# Patient Record
Sex: Male | Born: 1940 | Race: White | Hispanic: No | Marital: Married | Smoking: Current some day smoker
Health system: Southern US, Community
[De-identification: ages and names within clinical notes are randomized; demographics above are authoritative.]

## PROBLEM LIST (undated history)

## (undated) DIAGNOSIS — M419 Scoliosis, unspecified: Secondary | ICD-10-CM

## (undated) DIAGNOSIS — E119 Type 2 diabetes mellitus without complications: Secondary | ICD-10-CM

## (undated) DIAGNOSIS — I4891 Unspecified atrial fibrillation: Secondary | ICD-10-CM

## (undated) DIAGNOSIS — C679 Malignant neoplasm of bladder, unspecified: Secondary | ICD-10-CM

## (undated) DIAGNOSIS — C189 Malignant neoplasm of colon, unspecified: Secondary | ICD-10-CM

## (undated) DIAGNOSIS — Z87442 Personal history of urinary calculi: Secondary | ICD-10-CM

## (undated) DIAGNOSIS — M1990 Unspecified osteoarthritis, unspecified site: Secondary | ICD-10-CM

## (undated) DIAGNOSIS — I10 Essential (primary) hypertension: Secondary | ICD-10-CM

## (undated) DIAGNOSIS — I499 Cardiac arrhythmia, unspecified: Secondary | ICD-10-CM

## (undated) HISTORY — PX: TONSILLECTOMY: SUR1361

## (undated) HISTORY — PX: APPENDECTOMY: SHX54

## (undated) HISTORY — PX: CHOLECYSTECTOMY: SHX55

## (undated) HISTORY — DX: Malignant neoplasm of colon, unspecified: C18.9

---

## 1995-02-18 DIAGNOSIS — C189 Malignant neoplasm of colon, unspecified: Secondary | ICD-10-CM

## 1995-02-18 HISTORY — PX: COLECTOMY: SHX59

## 1995-02-18 HISTORY — DX: Malignant neoplasm of colon, unspecified: C18.9

## 2011-03-30 LAB — COMPREHENSIVE METABOLIC PANEL
Albumin: 3.9 g/dL (ref 3.4–5.0)
Alkaline Phosphatase: 57 U/L (ref 50–136)
Anion Gap: 10 (ref 7–16)
BUN: 13 mg/dL (ref 7–18)
Calcium, Total: 9 mg/dL (ref 8.5–10.1)
Co2: 28 mmol/L (ref 21–32)
EGFR (Non-African Amer.): >60
Glucose: 237 mg/dL — ABNORMAL HIGH (ref 65–99)
SGOT(AST): 17 U/L (ref 15–37)
Total Protein: 7.1 g/dL (ref 6.4–8.2)

## 2011-03-30 LAB — CBC
HCT: 49.3 % (ref 40.0–52.0)
MCH: 30.6 pg (ref 26.0–34.0)
MCHC: 34 g/dL (ref 32.0–36.0)
MCV: 90 fL (ref 80–100)
RDW: 14.1 % (ref 11.5–14.5)
WBC: 8.8 10*3/uL (ref 3.8–10.6)

## 2011-03-31 ENCOUNTER — Observation Stay: Payer: Self-pay | Admitting: Internal Medicine

## 2011-03-31 LAB — HEMOGLOBIN A1C: Hemoglobin A1C: 8.5 % — ABNORMAL HIGH (ref 4.2–6.3)

## 2014-06-11 NOTE — Discharge Summary (Signed)
PATIENT NAME:  Ryan Peters, Ryan Peters MR#:  [id number] DATE OF BIRTH:  02-04-1941  DATE OF ADMISSION:  03/31/2011 DATE OF DISCHARGE:  03/31/2011  DISCHARGE DIAGNOSIS: Double vision likely secondary to new onset diabetes mellitus, type II.  MEDICATIONS:  1. Metformin 500 mg p.o. b.i.d.  2. Lisinopril 5 mg per day. 3. Aspirin 81 mg p.o. daily.   DIET: ADA diet.   FOLLOW-UP: Follow-up with the patient's primary doctor. The patient has no primary doctor but he would like to see Dr. Ronnald Collum so I asked him to schedule appointment with Dr. Ronnald Collum for diabetes follow-up.   CONSULTATION: Diabetic teaching.   HOSPITAL COURSE: The patient is a 74 year old male patient with no significant past medical history who came in because of blurry vision and double vision which actually resolved the next day. The patient also thought he had a facial droop. He is edentulous but did not have any facial droop. He was admitted for possible TIA versus CVA. He has history of colon cancer status post chemotherapy and radiation in remission. Initial blood pressure was 130/73, pulse 75, respirations 18. The patient has no focal neurological deficits. CT of the head did not show any acute changes. However, the patient's blood glucose showed 237 and his hemoglobin A1c showed 8.5. We have discussed options of medications and also education about diabetes was given by the diabetic nurse and he was started on metformin 500 mg p.o. b.i.d. along with ADA diet. The patient's other labs are within normal limits including WBC and BMP. He had a carotid ultrasound and MRI of the brain for initial stroke work-up. Carotid ultrasound did not show any hemodynamically significant stenosis and MRI of the brain also showed subcortical and deep white matter changes but no evidence of acute ischemia. I told the patient that he can follow-up with Dr. Ronnald Collum for his diabetes and also needs appointment with ophthalmologist for his double vision which  actually resolved when I examined him the same day later. The patient said he will follow-up with an ophthalmologist.  CONDITION ON DISCHARGE: Stable.   DISCHARGE PREPARATION: Less than 30 minutes.   ____________________________ Epifanio Lesches, MD sk:drc D: 04/02/2011 22:32:06 ET T: 04/03/2011 12:30:29 ET JOB#: [id number]  cc: Epifanio Lesches, MD, <Dictator> Ryan Simmer, MD Epifanio Lesches MD ELECTRONICALLY SIGNED 04/07/2011 16:21

## 2014-06-11 NOTE — H&P (Signed)
PATIENT NAME:  Ryan Peters, Ryan Peters MR#:  [id number] DATE OF BIRTH:  [birth year]  DATE OF ADMISSION:  03/31/2011  PRIMARY CARE PHYSICIAN: Does not have one.   CHIEF COMPLAINT: Blurry vision and double vision.   HISTORY OF PRESENT ILLNESS: This is a 74 year old male who comes in from home due to blurry and double vision which has been intermittently going on for about two days. He describes that when he closes one eye he does not have any blurry vision, but when he looks with both eyes he sees double and has blurry vision.  This has been going on for the past couple of days. He denies any headache with these symptoms. He was brought to the ER for further evaluation. When evaluated by the ER physician he was noted to also have a slight left-sided facial droop, which I was not able to appreciate when I examined and saw the patient.  Hospitalist services were contacted for evaluation of suspected transient ischemic attack/cerebrovascular accident. The patient denies any headache presently. He denies any nausea, vomiting, fevers, chills, cough, chest pain, shortness of breath, or any other associated symptoms presently.   His blurry vision presently has resolved. He denies any numbness, tingling, or any focal weakness.   REVIEW OF SYSTEMS:  CONSTITUTIONAL: No documented fever. No weight gain, no weight loss. EYES: Positive blurry and double vision. ENT: No tinnitus or postnasal drip. No redness of the oropharynx. RESPIRATORY: No cough, no wheeze, no hemoptysis. CARDIOVASCULAR: No chest pain, no orthopnea, no palpitations, no syncope. GI: No nausea, vomiting, diarrhea, abdominal pain, melena, or hematochezia. GU: No dysuria or hematuria.  ENDOCRINE: No polyuria, nocturia, or heat or cold intolerance. HEME: No anemia. No bruising. No bleeding. INTEGUMENT: No rashes. No lesions. MUSCULOSKELETAL: No arthritis, no swelling, and no gout. NEUROLOGIC: No numbness, no tingling, no ataxia. No seizure-type activity. PSYCH: No  anxiety, no insomnia, no ADD.   PAST MEDICAL HISTORY:  1. Colon cancer status post surgery and chemotherapy, currently in remission.  2. History of ongoing tobacco abuse.   ALLERGIES: No known drug allergies.   SOCIAL HISTORY: Does smoke about a pack per day, has been smoking for the past 40+ years. No alcohol abuse. No illicit drug abuse. Lives at home with his wife.   FAMILY HISTORY: Both mother and father had diabetes and died from complications of heart disease.   CURRENT MEDICATIONS: He is currently on no medications.   PHYSICAL EXAMINATION ON ADMISSION:  VITAL SIGNS: He is afebrile, pulse 75, respirations 18, blood pressure 133/73, saturations 95% on room air.   GENERAL: The patient is a pleasant-appearing male in no apparent distress.   HEENT: The patient is atraumatic, normocephalic. Extraocular muscles are intact. Pupils are equal and reactive to light. Sclerae anicteric. No conjunctival injection. No pharyngeal erythema.   NECK: Supple. No jugular venous distention, no bruits, no lymphadenopathy, no thyromegaly.   HEART: Regular rate and rhythm. No murmurs, rubs, or clicks.   LUNGS: Clear to auscultation bilaterally. No rales, no rhonchi, no wheezes.   ABDOMEN: Soft, flat, nontender, nondistended. Has good bowel sounds. No hepatosplenomegaly appreciated.   EXTREMITIES:  No evidence of any cyanosis, clubbing, or peripheral edema. Has +2 pedal and radial pulses bilaterally.   NEUROLOGICAL: The patient is alert, awake, and oriented times three with no focal motor or sensory deficits appreciated bilaterally.   SKIN: Moist and warm with no rash appreciated.   LYMPHATIC: There is no cervical or axillary lymphadenopathy.  LABORATORY, DIAGNOSTIC, AND RADIOLOGICAL DATA:  Serum  glucose 237, BUN 13, creatinine 0.9, sodium 141, potassium 3.8, chloride 103, bicarbonate 28. The patient's liver function tests are within normal limits. Troponin less than 0.02. White cell count 8.8,  hemoglobin 16.8, hematocrit 49.3, platelet count 194.   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   ASSESSMENT AND PLAN: This is a 74 year old male with a history of colon cancer status post surgery and chemotherapy and ongoing tobacco abuse who came into the hospital with blurry and double vision and also slight left facial droop.  1. Blurry and double vision with left facial droop: We suspect this is related to a transient ischemic attack although his facial droop has now improved. He does not have any acute neurological symptoms. His CT of the head is negative. I will go ahead and get an MRI of his brain, get a carotid duplex, continue him on aspirin for now. Follow neuro checks.  2. Hypertension: The patient's blood pressure was somewhat elevated when he arrived. He does not have a history of hypertension. He is not currently taking any medications. I will tolerate some element of hypertension, suspicious of a stroke at this point. Place him on p.r.n. IV hydralazine and follow his hemodynamics.  3. History of colon cancer: Now in remission. No acute issues related to this.  4. New onset diabetes: The patient's blood sugar is over 200 at 237. I will check a hemoglobin A1c.  Place him on sliding scale insulin for now.  5. CODE STATUS:  The patient is a FULL CODE.  If the patient does truly have diabetes he likely will need a follow-up appointment with a primary care physician or primary care physician assigned.   TIME SPENT: 45 minutes.   ____________________________ Belia Heman. Verdell Carmine, MD vjs:bjt D: 03/31/2011 03:08:33 ET T: 03/31/2011 09:05:57 ET JOB#: [id number]  cc: Belia Heman. Verdell Carmine, MD, <Dictator> Henreitta Leber MD ELECTRONICALLY SIGNED 04/04/2011 10:36

## 2016-03-27 ENCOUNTER — Other Ambulatory Visit: Payer: Self-pay | Admitting: Urology

## 2016-03-31 ENCOUNTER — Encounter (HOSPITAL_COMMUNITY): Payer: Self-pay

## 2016-03-31 ENCOUNTER — Encounter (HOSPITAL_COMMUNITY)
Admission: RE | Admit: 2016-03-31 | Discharge: 2016-03-31 | Disposition: A | Discharge status: Home / Self Care | Payer: Medicare Other | Source: Ambulatory Visit | Attending: Urology | Admitting: Urology

## 2016-03-31 DIAGNOSIS — Z01812 Encounter for preprocedural laboratory examination: Secondary | ICD-10-CM | POA: Diagnosis not present

## 2016-03-31 DIAGNOSIS — C679 Malignant neoplasm of bladder, unspecified: Secondary | ICD-10-CM | POA: Diagnosis not present

## 2016-03-31 DIAGNOSIS — Z01810 Encounter for preprocedural cardiovascular examination: Secondary | ICD-10-CM | POA: Insufficient documentation

## 2016-03-31 HISTORY — DX: Type 2 diabetes mellitus without complications: E11.9

## 2016-03-31 HISTORY — DX: Essential (primary) hypertension: I10

## 2016-03-31 HISTORY — DX: Scoliosis, unspecified: M41.9

## 2016-03-31 HISTORY — DX: Unspecified osteoarthritis, unspecified site: M19.90

## 2016-03-31 HISTORY — DX: Personal history of urinary calculi: Z87.442

## 2016-03-31 LAB — CBC
HEMATOCRIT: 50.8 % (ref 39.0–52.0)
HEMOGLOBIN: 17.4 g/dL — AB (ref 13.0–17.0)
MCH: 31.2 pg (ref 26.0–34.0)
MCHC: 34.3 g/dL (ref 30.0–36.0)
MCV: 91.2 fL (ref 78.0–100.0)
Platelets: 220 10*3/uL (ref 150–400)
RBC: 5.57 MIL/uL (ref 4.22–5.81)
RDW: 14.1 % (ref 11.5–15.5)
WBC: 9.3 10*3/uL (ref 4.0–10.5)

## 2016-03-31 LAB — BASIC METABOLIC PANEL
Anion gap: 8 (ref 5–15)
BUN: 18 mg/dL (ref 6–20)
CHLORIDE: 104 mmol/L (ref 101–111)
CO2: 24 mmol/L (ref 22–32)
CREATININE: 1.35 mg/dL — AB (ref 0.61–1.24)
Calcium: 9.4 mg/dL (ref 8.9–10.3)
GFR calc Af Amer: 59 mL/min — ABNORMAL LOW (ref >60)
GFR calc non Af Amer: 50 mL/min — ABNORMAL LOW (ref >60)
Glucose, Bld: 133 mg/dL — ABNORMAL HIGH (ref 65–99)
Potassium: 5.1 mmol/L (ref 3.5–5.1)
Sodium: 136 mmol/L (ref 135–145)

## 2016-03-31 LAB — GLUCOSE, CAPILLARY: Glucose-Capillary: 145 mg/dL — ABNORMAL HIGH (ref 65–99)

## 2016-03-31 NOTE — Patient Instructions (Addendum)
Ryan Peters  03/31/2016   Your procedure is scheduled on: 04-09-16  Report to [facility] Main  Entrance take [facility]  elevators to 3rd floor to  [place] at 2:45PM.  Call this number if you have problems the morning of surgery [phone]   Remember: ONLY 1 PERSON MAY GO WITH YOU TO SHORT STAY TO GET  READY MORNING OF [place].  Do not eat food After Midnight. YOU MAY HAVE CLEAR LIQUIDS FROM MIDNIGHT UNTIL 1045AM ON DAY OF SURGERY. NOTHING BY MOUTH AFTER 10:45AM!     Take these medicines the morning of surgery with A SIP OF WATER: ALTENOL(TENORMIN) DO NOT TAKE ANY DIABETIC MEDICATIONS DAY OF YOUR SURGERY                               You may not have any metal on your body including hair pins and              piercings  Do not wear jewelry, make-up, lotions, powders or perfumes, deodorant             Do not wear nail polish.  Do not shave  48 hours prior to surgery.              Men may shave face and neck.   Do not bring valuables to the hospital. [place].  Contacts, dentures or bridgework may not be worn into surgery.  Leave suitcase in the car. After surgery it may be brought to your room.     Patients discharged the day of surgery will not be allowed to drive home.  Name and phone number of your driver:  Special Instructions: N/A              Please read over the following fact sheets you were given: _____________________________________________________________________     CLEAR LIQUID DIET   Foods Allowed                                                                     Foods Excluded  Coffee and tea, regular and decaf                             liquids that you cannot  Plain Jell-O in any flavor                                             see through such as: Fruit ices (not with fruit pulp)                                     milk, soups, orange juice  Iced Popsicles  All solid food Carbonated beverages, regular and diet                                    Cranberry, grape and apple juices Sports drinks like Gatorade Lightly seasoned clear broth or consume(fat free) Sugar, honey syrup  Sample Menu Breakfast                                Lunch                                     Supper Cranberry juice                    Beef broth                            Chicken broth Jell-O                                     Grape juice                           Apple juice Coffee or tea                        Jell-O                                      Popsicle                                                Coffee or tea                        Coffee or tea  _____________________________________________________________________             How to Manage Your Diabetes Before and After Surgery  Why is it important to control my blood sugar before and after surgery? . Improving blood sugar levels before and after surgery helps healing and can limit problems. . A way of improving blood sugar control is eating a healthy diet by: o  Eating less sugar and carbohydrates o  Increasing activity/exercise o  Talking with your doctor about reaching your blood sugar goals . High blood sugars (greater than 180 mg/dL) can raise your risk of infections and slow your recovery, so you will need to focus on controlling your diabetes during the weeks before surgery. . Make sure that the doctor who takes care of your diabetes knows about your planned surgery including the date and location.  How do I manage my blood sugar before surgery? . Check your blood sugar at least 4 times a day, starting 2 days before surgery, to make sure that the level is not too high or low. o Check your blood sugar the morning of your surgery when you wake up and every 2 hours until you get to the  Short Stay unit. . If your blood sugar is less than 70 mg/dL, you will need to treat  for low blood sugar: o Do not take insulin. o Treat a low blood sugar (less than 70 mg/dL) with  cup of clear juice (cranberry or apple), 4 glucose tablets, OR glucose gel. o Recheck blood sugar in 15 minutes after treatment (to make sure it is greater than 70 mg/dL). If your blood sugar is not greater than 70 mg/dL on recheck, call [phone] for further instructions. . Report your blood sugar to the short stay nurse when you get to Short Stay.  . If you are admitted to the hospital after surgery: o Your blood sugar will be checked by the staff and you will probably be given insulin after surgery (instead of oral diabetes medicines) to make sure you have good blood sugar levels. o The goal for blood sugar control after surgery is 80-180 mg/dL.   WHAT DO I DO ABOUT MY DIABETES MEDICATION?  Marland Kitchen Do not take oral diabetes medicines (pills) the morning of surgery.  . THE NIGHT BEFORE SURGERY, take  JANUMET AS USUAL      . THE MORNING OF SURGERY, DO NOT TAKE YOUR JANUMET!   Reviewed and Endorsed by [facility] Patient Education Committee, August 2015  [facility] - Preparing for Surgery Before surgery, you can play an important role.  Because skin is not sterile, your skin needs to be as free of germs as possible.  You can reduce the number of germs on your skin by washing with CHG (chlorahexidine gluconate) soap before surgery.  CHG is an antiseptic cleaner which kills germs and bonds with the skin to continue killing germs even after washing. Please DO NOT use if you have an allergy to CHG or antibacterial soaps.  If your skin becomes reddened/irritated stop using the CHG and inform your nurse when you arrive at Short Stay. Do not shave (including legs and underarms) for at least 48 hours prior to the first CHG shower.  You may shave your face/neck. Please follow these instructions carefully:  1.  Shower with CHG Soap the night before surgery and the  morning of Surgery.  2.  If you choose  to wash your hair, wash your hair first as usual with your  normal  shampoo.  3.  After you shampoo, rinse your hair and body thoroughly to remove the  shampoo.                           4.  Use CHG as you would any other liquid soap.  You can apply chg directly  to the skin and wash                       Gently with a scrungie or clean washcloth.  5.  Apply the CHG Soap to your body ONLY FROM THE NECK DOWN.   Do not use on face/ open                           Wound or open sores. Avoid contact with eyes, ears mouth and genitals (private parts).                       Wash face,  Genitals (private parts) with your normal soap.  6.  Wash thoroughly, paying special attention to the area where your surgery  will be performed.  7.  Thoroughly rinse your body with warm water from the neck down.  8.  DO NOT shower/wash with your normal soap after using and rinsing off  the CHG Soap.                9.  Pat yourself dry with a clean towel.            10.  Wear clean pajamas.            11.  Place clean sheets on your bed the night of your first shower and do not  sleep with pets. Day of Surgery : Do not apply any lotions/deodorants the morning of surgery.  Please wear clean clothes to the hospital/surgery center.  FAILURE TO FOLLOW THESE INSTRUCTIONS MAY RESULT IN THE CANCELLATION OF YOUR SURGERY PATIENT SIGNATURE_________________________________  NURSE SIGNATURE__________________________________  ________________________________________________________________________

## 2016-03-31 NOTE — Progress Notes (Signed)
ABNORMAL ECG SHOWN TO DR ROSE ANESTHESIA. PATIENT IS REQUIRED TO SEE A CARDIOLOGIST FRO CARDIAC CLEARANCE PRIOR TO SURGERY OR RISK CANCELLATION. LVMM WITH SELITA OF DR MANNY'S OFFICE TODAY CONCERNING THIS. PATIENT HAS ALSO BEEN MADE AWARE OF NEED FOR CARDIAC CLEARANCE. NURSE WILL FOLLOW UP AS NEEDED

## 2016-03-31 NOTE — Progress Notes (Signed)
Spoke to [facility] [place] from [facility] and she informed me that patient has a scheduled appointment with cardiologist office with PA-C Lauretta Chester on 04-04-16. Most recent EKGS were faxed over to [facility] office for purpose of comparison.

## 2016-04-01 LAB — HEMOGLOBIN A1C
Hgb A1c MFr Bld: 6 % — ABNORMAL HIGH (ref 4.8–5.6)
Mean Plasma Glucose: 126 mg/dL

## 2016-04-04 ENCOUNTER — Encounter: Payer: Self-pay | Admitting: Physician Assistant

## 2016-04-04 ENCOUNTER — Ambulatory Visit (INDEPENDENT_AMBULATORY_CARE_PROVIDER_SITE_OTHER): Payer: Medicare Other | Admitting: Cardiology

## 2016-04-04 VITALS — BP 140/88 | HR 100 | Ht 69.0 in | Wt 197.8 lb

## 2016-04-04 DIAGNOSIS — Z01810 Encounter for preprocedural cardiovascular examination: Secondary | ICD-10-CM | POA: Diagnosis not present

## 2016-04-04 DIAGNOSIS — R9431 Abnormal electrocardiogram [ECG] [EKG]: Secondary | ICD-10-CM | POA: Diagnosis not present

## 2016-04-04 DIAGNOSIS — I481 Persistent atrial fibrillation: Secondary | ICD-10-CM | POA: Diagnosis not present

## 2016-04-04 DIAGNOSIS — I4819 Other persistent atrial fibrillation: Secondary | ICD-10-CM

## 2016-04-04 MED ORDER — RIVAROXABAN 20 MG PO TABS
20.0000 mg | ORAL_TABLET | Freq: Every day | ORAL | 9 refills | Status: DC
Start: 2016-04-04 — End: 2016-04-09

## 2016-04-04 NOTE — Progress Notes (Deleted)
Cardiology Office Note   Date:  04/04/2016   ID:  Ryan Peters, DOB 1940-09-21, MRN [id number]  PCP:  Lenard Simmer, MD  Cardiologist:  Ival Bible, PA-C   No chief complaint on file.   History of Present Illness: Ryan Peters is a 76 y.o. male with a history of CKD, DM, HTN, colon CA  Pt needs surgery, ECG performed for preoperative evaluation and patient was in atrial fib with a RBBB, cardiology appointment made. RBBB present on ECG 2013  Ryan Peters presents for ***   Past Medical History:  Diagnosis Date  . Arthritis   . Chronic kidney disease    KIDNEY MASS  . Colon cancer ([place]) 1997  . Diabetes mellitus without complication ([place])   . History of kidney stones   . Hypertension   . Scoliosis     Past Surgical History:  Procedure Laterality Date  . APPENDECTOMY    . CHOLECYSTECTOMY    . COLECTOMY  1997  . TONSILLECTOMY      Current Outpatient Prescriptions  Medication Sig Dispense Refill  . aspirin EC 325 MG tablet Take 325 mg by mouth at bedtime.    Marland Kitchen atenolol (TENORMIN) 25 MG tablet Take 25 mg by mouth daily.    Marland Kitchen lisinopril (PRINIVIL,ZESTRIL) 5 MG tablet Take 5 mg by mouth daily.    Marland Kitchen MEGARED OMEGA-3 KRILL OIL 500 MG CAPS Take 500 mg by mouth See admin instructions. 3-4 times a week between 12pm-2pm    . naproxen sodium (ANAPROX) 220 MG tablet Take 440-660 mg by mouth daily as needed (for pain (seldom)).    Marland Kitchen sitaGLIPtin-metformin (JANUMET) 50-1000 MG tablet Take 1 tablet by mouth 2 (two) times daily.     No current facility-administered medications for this visit.     Allergies:   Patient has no known allergies.    Social History:  The patient  reports that he has been smoking Cigarettes.  He has a 25.00 pack-year smoking history. He has never used smokeless tobacco. He reports that he does not drink alcohol or use drugs.   Family History:  The patient's family history includes Heart attack in his mother.    ROS:  Please see the  history of present illness. All other systems are reviewed and negative.    PHYSICAL EXAM: VS:  There were no vitals taken for this visit. , BMI There is no height or weight on file to calculate BMI. GEN: Well nourished, well developed, male in no acute distress  HEENT: normal for age  Neck: no JVD, no carotid bruit, no masses Cardiac: RRR; no murmur, no rubs, or gallops Respiratory:  clear to auscultation bilaterally, normal work of breathing GI: soft, nontender, nondistended, + BS MS: no deformity or atrophy; no edema; distal pulses are 2+ in all 4 extremities   Skin: warm and dry, no rash Neuro:  Strength and sensation are intact Psych: euthymic mood, full affect   EKG:  EKG {ACTION; IS/IS [id number] ordered today. The ekg ordered today demonstrates ***   Recent Labs: 03/31/2016: BUN 18; Creatinine, Ser 1.35; Hemoglobin 17.4; Platelets 220; Potassium 5.1; Sodium 136    Lipid Panel No results found for: CHOL, TRIG, HDL, CHOLHDL, VLDL, LDLCALC, LDLDIRECT   Wt Readings from Last 3 Encounters:  03/31/16 200 lb (90.7 kg)     Other studies Reviewed: Additional studies/ records that were reviewed today include: ***.  ASSESSMENT AND PLAN:  1.  ***   Current medicines are reviewed at  length with the patient today.  The patient {ACTIONS; HAS/DOES NOT HAVE:19233} concerns regarding medicines.  The following changes have been made:  {PLAN; NO CHANGE:13088:s}  Labs/ tests ordered today include: *** No orders of the defined types were placed in this encounter.    Disposition:   FU with ***  Signed, Rosaria Ferries, PA-C  04/04/2016 8:45 AM    [facility] Phone: [phone]; Fax: [phone]  This note was written with the assistance of speech recognition software. Please excuse any transcriptional errors.

## 2016-04-04 NOTE — Patient Instructions (Addendum)
Medication Instructions:  START- Xarelto 20 mg daily  Labwork: None Ordered  Testing/Procedures: Your physician has requested that you have an echocardiogram. Echocardiography is a painless test that uses sound waves to create images of your heart. It provides your doctor with information about the size and shape of your heart and how well your heart's chambers and valves are working. This procedure takes approximately one hour. There are no restrictions for this procedure.   Follow-Up: Your physician recommends that you schedule a follow-up appointment in: After Surgery   Any Other Special Instructions Will Be Listed Below (If Applicable).   If you need a refill on your cardiac medications before your next appointment, please call your pharmacy.

## 2016-04-04 NOTE — Progress Notes (Signed)
Patient sent for cardiac clearance d/t abnormal EKG.   Patient now has cardiac clearance through Dr Virginia Rochester 04-04-16

## 2016-04-04 NOTE — Progress Notes (Signed)
Cardiology Office Note   Date:  04/06/2016   ID:  Ryan Peters, DOB July 23, 1940, MRN [id number]  PCP:  Lenard Simmer, MD  Cardiologist:   Minus Breeding, MD    Chief Complaint  Patient presents with  . Atrial Fibrillation      History of Present Illness: Ryan Peters is a 76 y.o. male who presents for evaluation of atrial fibrillation. The patient reports that he's had stress tests in the past and thinks his last one was a Myoview in 2016.  However, I don't have any details of this and he's not exactly sure why this was done. He's not had any symptoms. He said he's never been told he was out of rhythm. However, recently he was evaluated prior to having bladder surgery apparently for treatment of cancer. A couple of days ago he was noted to be in atrial fibrillation. Previous EKGs have demonstrated chronic right bundle branch block. He's not had any symptoms. He says he is active. The patient denies any new symptoms such as chest discomfort, neck or arm discomfort. There has been no new shortness of breath, PND or orthopnea. There have been no reported palpitations, presyncope or syncope.   Past Medical History:  Diagnosis Date  . Arthritis   . Colon cancer ([place]) 1997  . Diabetes mellitus without complication ([place])   . History of kidney stones   . Hypertension   . Scoliosis     Past Surgical History:  Procedure Laterality Date  . APPENDECTOMY    . CHOLECYSTECTOMY    . COLECTOMY  1997  . TONSILLECTOMY       Current Outpatient Prescriptions  Medication Sig Dispense Refill  . aspirin EC 325 MG tablet Take 325 mg by mouth at bedtime.    Marland Kitchen atenolol (TENORMIN) 25 MG tablet Take 25 mg by mouth daily.    Marland Kitchen lisinopril (PRINIVIL,ZESTRIL) 5 MG tablet Take 5 mg by mouth daily.    Marland Kitchen MEGARED OMEGA-3 KRILL OIL 500 MG CAPS Take 500 mg by mouth See admin instructions. 3-4 times a week between 12pm-2pm    . naproxen sodium (ANAPROX) 220 MG tablet Take 440-660 mg by mouth daily as  needed (for pain (seldom)).    Marland Kitchen sitaGLIPtin-metformin (JANUMET) 50-1000 MG tablet Take 1 tablet by mouth 2 (two) times daily.    . rivaroxaban (XARELTO) 20 MG TABS tablet Take 1 tablet (20 mg total) by mouth daily with supper. 30 tablet 9   No current facility-administered medications for this visit.     Allergies:   Patient has no known allergies.    Social History:  The patient  reports that he has been smoking Cigarettes.  He has a 25.00 pack-year smoking history. He has never used smokeless tobacco. He reports that he does not drink alcohol or use drugs.   Family History:  The patient's family history includes Diabetes in his mother; Heart attack (age of onset: 19) in his father.    ROS:  Please see the history of present illness.   Otherwise, review of systems are positive for sporadic hematuria.   All other systems are reviewed and negative.    PHYSICAL EXAM: VS:  BP 140/88   Pulse 100   Ht 5\' 9"  (1.753 m)   Wt 197 lb 12.8 oz (89.7 kg)   BMI 29.21 kg/m  , BMI Body mass index is 29.21 kg/m. GENERAL:  Well appearing HEENT:  Pupils equal round and reactive, fundi not visualized, oral mucosa unremarkable NECK:  No jugular venous distention, waveform within normal limits, carotid upstroke brisk and symmetric, no bruits, no thyromegaly LYMPHATICS:  No cervical, inguinal adenopathy LUNGS:  Clear to auscultation bilaterally BACK:  No CVA tenderness CHEST:  Unremarkable, ventral hernia HEART:  PMI not displaced or sustained,S1 and S2 within normal limits, no S3, , no guarding, no midline pulsatile mass, no hepatomegaly, no splenomegaly, irregular EXT:  2 plus pulses throughout, no edema, no cyanosis no clubbing SKIN:  No rashes no nodules NEURO:  Cranial nerves II through XII grossly intact, motor grossly intact throughout PSYCH:  Cognitively intact, oriented to person place and time    EKG:  EKG is ordered today. The ekg ordered today demonstrates atrial fibrillation, rate  100, right bundle branch block, no acute ST-T wave changes.   Recent Labs: 03/31/2016: BUN 18; Creatinine, Ser 1.35; Hemoglobin 17.4; Platelets 220; Potassium 5.1; Sodium 136    Lipid Panel No results found for: CHOL, TRIG, HDL, CHOLHDL, VLDL, LDLCALC, LDLDIRECT    Wt Readings from Last 3 Encounters:  04/04/16 197 lb 12.8 oz (89.7 kg)  03/31/16 200 lb (90.7 kg)      Other studies Reviewed: Additional studies/ records that were reviewed today include: Previous EKG. Review of the above records demonstrates:  Please see elsewhere in the note.     ASSESSMENT AND PLAN:  ATRIAL FIB :  The patient is atrial fibrillation of unknown onset.  He has no symptoms related to this. He will need to be on anticoagulation.  Mr. Ryan Peters has a CHA2DS2 - VASc score of 2.  However, we can defer starting this until after his surgery. I will assess the urologist to let us know when they think it is safe to start Xarelto at 20 mg daily. I will check an echocardiogram. I will follow-up with a Holter to make sure he has good rate control the future. Of note we did discuss the risk benefits and he has no apparent contraindication to long-term anticoagulation.   RBBB:  This is chronic. This should not be an issue going forward.  HTN: His blood pressure is borderline keep an eye on this and titrate meds accordingly.  TOBACCO:  We discussed the need to stop smoking.  He thinks he can quit cold [place].   PREOP:  The patient has a very high functional level. He has no unstable symptoms or signs. He is not going for a high-risk procedure from a surgical standpoint. Therefore, based on this the patient is at acceptable risk for the planned surgery.    Current medicines are reviewed at length with the patient today.  The patient does not have concerns regarding medicines.  The following changes have been made:  no change    Orders Placed This Encounter  Procedures  . ECHOCARDIOGRAM COMPLETE      Disposition:   FU with me after bladder surgery.     Signed, Minus Breeding, MD  04/06/2016 8:24 PM    [place]

## 2016-04-06 ENCOUNTER — Encounter: Payer: Self-pay | Admitting: Cardiology

## 2016-04-08 MED ORDER — GENTAMICIN SULFATE 40 MG/ML IJ SOLN
400.0000 mg | Freq: Once | INTRAVENOUS | Status: AC
  Administered 2016-04-09: 400 mg via INTRAVENOUS
  Filled 2016-04-08: qty 10

## 2016-04-09 ENCOUNTER — Ambulatory Visit (HOSPITAL_COMMUNITY)
Admission: RE | Admit: 2016-04-09 | Discharge: 2016-04-09 | Disposition: A | Discharge status: Home / Self Care | Payer: Medicare Other | Source: Ambulatory Visit | Attending: Urology | Admitting: Urology

## 2016-04-09 ENCOUNTER — Ambulatory Visit (HOSPITAL_COMMUNITY): Payer: Medicare Other | Admitting: Anesthesiology

## 2016-04-09 ENCOUNTER — Encounter (HOSPITAL_COMMUNITY)
Admission: RE | Disposition: A | Discharge status: Home / Self Care | Payer: Self-pay | Source: Ambulatory Visit | Attending: Urology

## 2016-04-09 ENCOUNTER — Encounter (HOSPITAL_COMMUNITY): Payer: Self-pay | Admitting: *Deleted

## 2016-04-09 DIAGNOSIS — Z87442 Personal history of urinary calculi: Secondary | ICD-10-CM | POA: Insufficient documentation

## 2016-04-09 DIAGNOSIS — C675 Malignant neoplasm of bladder neck: Secondary | ICD-10-CM | POA: Diagnosis not present

## 2016-04-09 DIAGNOSIS — Z85038 Personal history of other malignant neoplasm of large intestine: Secondary | ICD-10-CM | POA: Insufficient documentation

## 2016-04-09 DIAGNOSIS — N281 Cyst of kidney, acquired: Secondary | ICD-10-CM | POA: Diagnosis not present

## 2016-04-09 DIAGNOSIS — I10 Essential (primary) hypertension: Secondary | ICD-10-CM | POA: Insufficient documentation

## 2016-04-09 DIAGNOSIS — F17210 Nicotine dependence, cigarettes, uncomplicated: Secondary | ICD-10-CM | POA: Insufficient documentation

## 2016-04-09 DIAGNOSIS — I4891 Unspecified atrial fibrillation: Secondary | ICD-10-CM | POA: Insufficient documentation

## 2016-04-09 DIAGNOSIS — R310 Gross hematuria: Secondary | ICD-10-CM | POA: Diagnosis not present

## 2016-04-09 DIAGNOSIS — N200 Calculus of kidney: Secondary | ICD-10-CM | POA: Insufficient documentation

## 2016-04-09 DIAGNOSIS — M1990 Unspecified osteoarthritis, unspecified site: Secondary | ICD-10-CM | POA: Diagnosis not present

## 2016-04-09 DIAGNOSIS — Z9049 Acquired absence of other specified parts of digestive tract: Secondary | ICD-10-CM | POA: Diagnosis not present

## 2016-04-09 DIAGNOSIS — C679 Malignant neoplasm of bladder, unspecified: Secondary | ICD-10-CM | POA: Diagnosis present

## 2016-04-09 DIAGNOSIS — E119 Type 2 diabetes mellitus without complications: Secondary | ICD-10-CM | POA: Diagnosis not present

## 2016-04-09 DIAGNOSIS — M419 Scoliosis, unspecified: Secondary | ICD-10-CM | POA: Diagnosis not present

## 2016-04-09 HISTORY — PX: TRANSURETHRAL RESECTION OF BLADDER TUMOR: SHX2575

## 2016-04-09 HISTORY — DX: Cardiac arrhythmia, unspecified: I49.9

## 2016-04-09 HISTORY — PX: CYSTOSCOPY W/ URETERAL STENT PLACEMENT: SHX1429

## 2016-04-09 LAB — GLUCOSE, CAPILLARY
Glucose-Capillary: 96 mg/dL (ref 65–99)
Glucose-Capillary: 105 mg/dL — ABNORMAL HIGH (ref 65–99)

## 2016-04-09 SURGERY — CYSTOSCOPY, WITH RETROGRADE PYELOGRAM AND URETERAL STENT INSERTION
Anesthesia: General | Site: Bladder

## 2016-04-09 MED ORDER — FENTANYL CITRATE (PF) 100 MCG/2ML IJ SOLN
INTRAMUSCULAR | Status: DC | PRN
  Administered 2016-04-09 (×2): 50 ug via INTRAVENOUS

## 2016-04-09 MED ORDER — PROMETHAZINE HCL 25 MG/ML IJ SOLN: 6.2500 mg | INTRAMUSCULAR | Status: DC | PRN

## 2016-04-09 MED ORDER — BELLADONNA ALKALOIDS-OPIUM 16.2-60 MG RE SUPP
RECTAL | Status: AC
  Filled 2016-04-09: qty 1

## 2016-04-09 MED ORDER — SODIUM CHLORIDE 0.9 % IJ SOLN
INTRAMUSCULAR | Status: DC | PRN
  Administered 2016-04-09: 15 mL

## 2016-04-09 MED ORDER — TRAMADOL HCL 50 MG PO TABS: 50.0000 mg | ORAL_TABLET | Freq: Four times a day (QID) | ORAL | 0 refills | Status: DC | PRN

## 2016-04-09 MED ORDER — ATENOLOL 25 MG PO TABS
25.0000 mg | ORAL_TABLET | ORAL | Status: AC
  Administered 2016-04-09: 25 mg via ORAL
  Filled 2016-04-09: qty 1

## 2016-04-09 MED ORDER — FENTANYL CITRATE (PF) 100 MCG/2ML IJ SOLN: 25.0000 ug | INTRAMUSCULAR | Status: DC | PRN

## 2016-04-09 MED ORDER — LIDOCAINE 2% (20 MG/ML) 5 ML SYRINGE
INTRAMUSCULAR | Status: DC | PRN
  Administered 2016-04-09: 80 mg via INTRAVENOUS

## 2016-04-09 MED ORDER — CEPHALEXIN 500 MG PO CAPS: 500.0000 mg | ORAL_CAPSULE | Freq: Two times a day (BID) | ORAL | 0 refills | Status: DC

## 2016-04-09 MED ORDER — SODIUM CHLORIDE 0.9 % IR SOLN
Status: DC | PRN
  Administered 2016-04-09: 1000 mL
  Administered 2016-04-09 (×4): 3000 mL

## 2016-04-09 MED ORDER — PROPOFOL 10 MG/ML IV BOLUS
INTRAVENOUS | Status: DC | PRN
  Administered 2016-04-09: 150 mg via INTRAVENOUS

## 2016-04-09 MED ORDER — LIDOCAINE HCL 2 % EX GEL
CUTANEOUS | Status: AC
  Filled 2016-04-09: qty 5

## 2016-04-09 MED ORDER — LACTATED RINGERS IV SOLN
INTRAVENOUS | Status: DC
  Administered 2016-04-09: 15:00:00 via INTRAVENOUS

## 2016-04-09 MED ORDER — ONDANSETRON HCL 4 MG/2ML IJ SOLN
INTRAMUSCULAR | Status: DC | PRN
  Administered 2016-04-09: 4 mg via INTRAVENOUS

## 2016-04-09 SURGICAL SUPPLY — 24 items
BAG URINE DRAINAGE (UROLOGICAL SUPPLIES) ×4 IMPLANT
BAG URO CATCHER STRL LF (MISCELLANEOUS) ×4 IMPLANT
BASKET ZERO TIP NITINOL 2.4FR (BASKET) IMPLANT
CATH HEMA 3WAY 30CC 22FR COUDE (CATHETERS) ×4 IMPLANT
CATH INTERMIT  6FR 70CM (CATHETERS) ×4 IMPLANT
CLOTH BEACON ORANGE TIMEOUT ST (SAFETY) ×4 IMPLANT
ELECT REM PT RETURN 9FT ADLT (ELECTROSURGICAL) ×4
ELECTRODE REM PT RTRN 9FT ADLT (ELECTROSURGICAL) ×2 IMPLANT
EVACUATOR MICROVAS BLADDER (UROLOGICAL SUPPLIES) IMPLANT
GLOVE BIO SURGEON STRL SZ7 (GLOVE) ×4 IMPLANT
GLOVE BIOGEL M STRL SZ7.5 (GLOVE) ×4 IMPLANT
GOWN BRE IMP SLV AUR XL STRL (GOWN DISPOSABLE) ×4 IMPLANT
GOWN STRL REUS W/TWL LRG LVL3 (GOWN DISPOSABLE) ×4 IMPLANT
GUIDEWIRE ANG ZIPWIRE 038X150 (WIRE) IMPLANT
GUIDEWIRE STR DUAL SENSOR (WIRE) ×4 IMPLANT
LOOP CUT BIPOLAR 24F LRG (ELECTROSURGICAL) ×4 IMPLANT
MANIFOLD NEPTUNE II (INSTRUMENTS) ×4 IMPLANT
PACK CYSTO (CUSTOM PROCEDURE TRAY) ×4 IMPLANT
PLUG CATH AND CAP STER (CATHETERS) ×4 IMPLANT
SET ASPIRATION TUBING (TUBING) IMPLANT
SYR 30ML LL (SYRINGE) ×4 IMPLANT
SYRINGE IRR TOOMEY STRL 70CC (SYRINGE) ×4 IMPLANT
TUBING CONNECTING 10 (TUBING) ×3 IMPLANT
TUBING CONNECTING 10' (TUBING) ×1

## 2016-04-09 NOTE — Brief Op Note (Signed)
04/09/2016  5:21 PM  PATIENT:  Ryan Peters  76 y.o. male  PRE-OPERATIVE DIAGNOSIS:  LARGE VOLUME BLADDER CANCER  POST-OPERATIVE DIAGNOSIS:  LARGE VOLUME BLADDER CANCER  PROCEDURE:  Procedure(s): CYSTOSCOPY WITH RETROGRADE PYELOGRAM/ POSSIBLE STENT PLACEMENT (N/A) TRANSURETHRAL RESECTION OF BLADDER TUMOR (TURBT) (N/A)  SURGEON:  Surgeon(s) and Role:    * Alexis Frock, MD - Primary  PHYSICIAN ASSISTANT:   ASSISTANTS: none   ANESTHESIA:   general  EBL:  No intake/output data recorded.  BLOOD ADMINISTERED:none  DRAINS: 59F 3 way hematuria catheter, irrigation port plugged.    LOCAL MEDICATIONS USED:  NONE  SPECIMEN:  Source of Specimen:  1 - bladder tumor, 2 - base of bladder tumor, 3 - prostatic urethra  DISPOSITION OF SPECIMEN:  PATHOLOGY  COUNTS:  YES  TOURNIQUET:  * No tourniquets in log *  DICTATION: .Other Dictation: Dictation Number [id number]  PLAN OF CARE: Discharge to home after PACU  PATIENT DISPOSITION:  PACU - hemodynamically stable.   Delay start of Pharmacological VTE agent (>24hrs) due to surgical blood loss or risk of bleeding: yes

## 2016-04-09 NOTE — H&P (Signed)
Ryan Peters is an 76 y.o. male.    Chief Complaint: Pre-op Transurethral Resection of Bladder Tumor  HPI:   1 - Gross Hematuria - on / off visible blood and debris in urine since around 2016. CT 2018 with large volume bladder cancer w/o obvious distant disease and large volume non-obstructing stones (Rt partial stag, Lt lower pole).   2 - Bladder Cancer - new bladder cancer by CT/ Cysto 2018.  No pelvic adenopathy or obvious locally advanced disease. No tissue diagnosis as of yet.   3 - Large Volume Nephrolithiasis - 2.5cm Rt renal pelvis partial stagorn stone w/o hydro and 30mm LLP stone w/o hydro also in CT 02/2016.   4 - Non-Complex Rt Renal Cyst - 2cm Rt lower pole cyst on hematuria CT 02/2016. No enhancing nodules / mass effect / or coarse calcifications.   5 - Prostate Screening - PSA 0.2 / DRE normal 2018 at age 31 as part of hematuria eval ===> no further screening.   PMH sig for colon cancer s/p segmental resection x2, large abd hernia, DM2 (A1c <6!). His PCP is S. Morayati MD.   Today " [name] " is seen to proceed with TURBT. Most recent Cr 1.35, Hgb 17. No interval fevers.    Past Medical History:  Diagnosis Date  . Arthritis   . Colon cancer ([place]) 1997  . Diabetes mellitus without complication ([place])   . History of kidney stones   . Hypertension   . Scoliosis     Past Surgical History:  Procedure Laterality Date  . APPENDECTOMY    . CHOLECYSTECTOMY    . COLECTOMY  1997  . TONSILLECTOMY      Family History  Problem Relation Age of Onset  . Diabetes Mother   . Heart attack Father 55   Social History:  reports that he has been smoking Cigarettes.  He has a 25.00 pack-year smoking history. He has never used smokeless tobacco. He reports that he does not drink alcohol or use drugs.  Allergies: No Known Allergies  No prescriptions prior to admission.    No results found for this or any previous visit (from the past 48 hour(s)). No results found.  Review of  Systems  Constitutional: Negative.  Negative for chills and fever.  HENT: Negative.   Eyes: Negative.   Respiratory: Negative.   Cardiovascular: Negative.   Gastrointestinal: Negative.   Genitourinary: Positive for hematuria. Negative for flank pain.  Musculoskeletal: Negative.   Skin: Negative.   Neurological: Negative.   Endo/Heme/Allergies: Negative.   Psychiatric/Behavioral: Negative.     There were no vitals taken for this visit. Physical Exam  Constitutional: He appears well-developed.  HENT:  Head: Normocephalic.  Eyes: Pupils are equal, round, and reactive to light.  Neck: Normal range of motion.  Cardiovascular: Normal rate.   Respiratory: Effort normal.  GI:  Multiple abdominal scars.   Genitourinary: Penis normal.  Musculoskeletal: Normal range of motion.  Neurological: He is alert.  Skin: Skin is warm.  Psychiatric: He has a normal mood and affect. His behavior is normal. Thought content normal.     Assessment/Plan  Proceed as planned with cysto, retrogrades, TURBT today. May require resection of left UO / stent. Risks, benefits, alternatives, expected peri-op course, need for temporary post-op catheter discussed previously and rieterated today.   Alexis Frock, MD 04/09/2016, 4:10 AM

## 2016-04-09 NOTE — Discharge Instructions (Signed)
Foley Catheter Care, Adult °A Foley catheter is a soft, flexible tube. This tube is placed into your bladder to drain pee (urine). If you go home with this catheter in place, follow the instructions below. °TAKING CARE OF THE CATHETER °1. Wash your hands with soap and water. °2. Put soap and water on a clean washcloth. °¨ Clean the skin where the tube goes into your body. °§ Clean away from the tube site. °§ Never wipe toward the tube. °§ Clean the area using a circular motion. °¨ Remove all the soap. Pat the area dry with a clean towel. For males, reposition the skin that covers the end of the penis (foreskin). °3. Attach the tube to your leg with tape or a leg strap. Do not stretch the tube tight. If you are using tape, remove any stickiness left behind by past tape you used. °4. Keep the drainage bag below your hips. Keep it off the floor. °5. Check your tube during the day. Make sure it is working and draining. Make sure the tube does not curl, twist, or bend. °6. Do not pull on the tube or try to take it out. °TAKING CARE OF THE DRAINAGE BAGS °You will have a large overnight drainage bag and a small leg bag. You may wear the overnight bag any time. Never wear the small bag at night. Follow the directions below. °Emptying the Drainage Bag  °Empty your drainage bag when it is ?-½ full or at least 2-3 times a day. °1. Wash your hands with soap and water. °2. Keep the drainage bag below your hips. °3. Hold the dirty bag over the toilet or clean container. °4. Open the pour spout at the bottom of the bag. Empty the pee into the toilet or container. Do not let the pour spout touch anything. °5. Clean the pour spout with a gauze pad or cotton ball that has rubbing alcohol on it. °6. Close the pour spout. °7. Attach the bag to your leg with tape or a leg strap. °8. Wash your hands well. °Changing the Drainage Bag  °Change your bag once a month or sooner if it starts to smell or look dirty.  °1. Wash your hands with  soap and water. °2. Pinch the rubber tube so that pee does not spill out. °3. Disconnect the catheter tube from the drainage tube at the connection valve. Do not let the tubes touch anything. °4. Clean the end of the catheter tube with an alcohol wipe. Clean the end of a the drainage tube with a different alcohol wipe. °5. Connect the catheter tube to the drainage tube of the clean drainage bag. °6. Attach the new bag to the leg with tape or a leg strap. Avoid attaching the new bag too tightly. °7. Wash your hands well. °Cleaning the Drainage Bag  °1. Wash your hands with soap and water. °2. Wash the bag in warm, soapy water. °3. Rinse the bag with warm water. °4. Fill the bag with a mixture of white vinegar and water (1 cup vinegar to 1 quart warm water [.2 liter vinegar to 1 liter warm water]). Close the bag and soak it for 30 minutes in the solution. °5. Rinse the bag with warm water. °6. Hang the bag to dry with the pour spout open and hanging downward. °7. Store the clean bag (once it is dry) in a clean plastic bag. °8. Wash your hands well. °PREVENT INFECTION °· Wash your hands before and after touching   your tube.  Take showers every day. Wash the skin where the tube enters your body. Do not take baths. Replace wet leg straps with dry ones, if this applies.  Do not use powders, sprays, or lotions on the genital area. Only use creams, lotions, or ointments as told by your doctor.  For females, wipe from front to back after going to the bathroom.  Drink enough fluids to keep your pee clear or pale yellow unless you are told not to have too much fluid (fluid restriction).  Do not let the drainage bag or tubing touch or lie on the floor.  Wear cotton underwear to keep the area dry. GET HELP IF:  Your pee is cloudy or smells unusually bad.  Your tube becomes clogged.  You are not draining pee into the bag or your bladder feels full.  Your tube starts to leak. GET HELP RIGHT AWAY IF:  You  have pain, puffiness (swelling), redness, or yellowish-white fluid (pus) where the tube enters the body.  You have pain in the belly (abdomen), legs, lower back, or bladder.  You have a fever.  You see blood fill the tube, or your pee is pink or red.  You feel sick to your stomach (nauseous), throw up (vomit), or have chills.  Your tube gets pulled out. MAKE SURE YOU:   Understand these instructions.  Will watch your condition.  Will get help right away if you are not doing well or get worse. This information is not intended to replace advice given to you by your health care provider. Make sure you discuss any questions you have with your health care provider. Document Released: 05/31/2012 Document Revised: 02/24/2014 Document Reviewed: 01/20/2015 Elsevier Interactive Patient Education  2017 [place] may have urinary urgency (bladder spasms) and bloody urine on / off with catheter in place. This is normal.  2 - Call MD or go to ER for fever >102, severe pain / nausea / vomiting not relieved by medications, or acute change in medical status     General Anesthesia, Adult, Care After These instructions provide you with information about caring for yourself after your procedure. Your health care provider may also give you more specific instructions. Your treatment has been planned according to current medical practices, but problems sometimes occur. Call your health care provider if you have any problems or questions after your procedure. What can I expect after the procedure? After the procedure, it is common to have:  Vomiting.  A sore throat.  Mental slowness. It is common to feel:  Nauseous.  Cold or shivery.  Sleepy.  Tired.  Sore or achy, even in parts of your body where you did not have surgery. Follow these instructions at home: For at least 24 hours after the procedure:  Do not:  Participate in activities where you could fall or become  injured.  Drive.  Use heavy machinery.  Drink alcohol.  Take sleeping pills or medicines that cause drowsiness.  Make important decisions or sign legal documents.  Take care of children on your own.  Rest. Eating and drinking  If you vomit, drink water, juice, or soup when you can drink without vomiting.  Drink enough fluid to keep your urine clear or pale yellow.  Make sure you have little or no nausea before eating solid foods.  Follow the diet recommended by your health care provider. General instructions  Have a responsible adult stay with you until you  are awake and alert.  Return to your normal activities as told by your health care provider. Ask your health care provider what activities are safe for you.  Take over-the-counter and prescription medicines only as told by your health care provider.  If you smoke, do not smoke without supervision.  Keep all follow-up visits as told by your health care provider. This is important. Contact a health care provider if:  You continue to have nausea or vomiting at home, and medicines are not helpful.  You cannot drink fluids or start eating again.  You cannot urinate after 8-12 hours.  You develop a skin rash.  You have fever.  You have increasing redness at the site of your procedure. Get help right away if:  You have difficulty breathing.  You have chest pain.  You have unexpected bleeding.  You feel that you are having a life-threatening or urgent problem. This information is not intended to replace advice given to you by your health care provider. Make sure you discuss any questions you have with your health care provider. Document Released: 05/12/2000 Document Revised: 07/09/2015 Document Reviewed: 01/18/2015 Elsevier Interactive Patient Education  2017 [employer].

## 2016-04-09 NOTE — Transfer of Care (Signed)
Immediate Anesthesia Transfer of Care Note  Patient: Ryan Peters  Procedure(s) Performed: Procedure(s): CYSTOSCOPY WITH RETROGRADE PYELOGRAM/ (N/A) TRANSURETHRAL RESECTION OF BLADDER TUMOR (TURBT) (N/A)  Patient Location: PACU  Anesthesia Type:General  Level of Consciousness: awake, alert  and oriented  Airway & Oxygen Therapy: Patient Spontanous Breathing and Patient connected to face mask oxygen  Post-op Assessment: Report given to RN and Post -op Vital signs reviewed and stable  Post vital signs: Reviewed and stable  Last Vitals:  Vitals:   04/09/16 1437  BP: 128/88  Pulse: (!) 105  Resp: 18  Temp: 36.5 C    Last Pain:  Vitals:   04/09/16 1437  TempSrc: Oral      Patients Stated Pain Goal: 4 ([id number])  Complications: No apparent anesthesia complications

## 2016-04-09 NOTE — Anesthesia Preprocedure Evaluation (Addendum)
Anesthesia Evaluation  Patient identified by MRN, date of birth, ID band Patient awake    Reviewed: Allergy & Precautions, NPO status , Patient's Chart, lab work & pertinent test results, reviewed documented beta blocker date and time   Airway Mallampati: II  TM Distance: >3 FB Neck ROM: Full    Dental  (+) Edentulous Upper, Edentulous Lower   Pulmonary Current Smoker,    breath sounds clear to auscultation       Cardiovascular hypertension, Pt. on medications and Pt. on home beta blockers + dysrhythmias Atrial Fibrillation  Rhythm:Irregular Rate:Normal     Neuro/Psych negative neurological ROS     GI/Hepatic negative GI ROS, Neg liver ROS,   Endo/Other  diabetes, Type 2  Renal/GU Renal InsufficiencyRenal diseaseBladder CA     Musculoskeletal  (+) Arthritis ,   Abdominal   Peds  Hematology negative hematology ROS (+)   Anesthesia Other Findings   Reproductive/Obstetrics                            Lab Results  Component Value Date   WBC 9.3 03/31/2016   HGB 17.4 (H) 03/31/2016   HCT 50.8 03/31/2016   MCV 91.2 03/31/2016   PLT 220 03/31/2016   Lab Results  Component Value Date   CREATININE 1.35 (H) 03/31/2016   BUN 18 03/31/2016   NA 136 03/31/2016   K 5.1 03/31/2016   CL 104 03/31/2016   CO2 24 03/31/2016    Anesthesia Physical Anesthesia Plan  ASA: III  Anesthesia Plan: General   Post-op Pain Management:    Induction: Intravenous  Airway Management Planned: LMA  Additional Equipment:   Intra-op Plan:   Post-operative Plan: Extubation in OR  Informed Consent: I have reviewed the patients History and Physical, chart, labs and discussed the procedure including the risks, benefits and alternatives for the proposed anesthesia with the patient or authorized representative who has indicated his/her understanding and acceptance.   Dental advisory given  Plan Discussed  with: CRNA  Anesthesia Plan Comments:        Anesthesia Quick Evaluation

## 2016-04-09 NOTE — Anesthesia Procedure Notes (Signed)
Procedure Name: LMA Insertion Date/Time: 04/09/2016 4:42 PM Performed by: Noralyn Pick D Pre-anesthesia Checklist: Patient identified, Emergency Drugs available, Suction available and Patient being monitored Patient Re-evaluated:Patient Re-evaluated prior to inductionOxygen Delivery Method: Circle system utilized Preoxygenation: Pre-oxygenation with 100% oxygen Intubation Type: IV induction Ventilation: Mask ventilation without difficulty LMA: LMA inserted LMA Size: 4.0 Tube type: Oral Number of attempts: 1 Placement Confirmation: positive ETCO2 and breath sounds checked- equal and bilateral Tube secured with: Tape Dental Injury: Teeth and Oropharynx as per pre-operative assessment

## 2016-04-09 NOTE — Anesthesia Postprocedure Evaluation (Signed)
Anesthesia Post Note  Patient: Ryan Peters  Procedure(s) Performed: Procedure(s) (LRB): CYSTOSCOPY WITH RETROGRADE PYELOGRAM/ (N/A) TRANSURETHRAL RESECTION OF BLADDER TUMOR (TURBT) (N/A)  Patient location during evaluation: PACU Anesthesia Type: General Level of consciousness: awake and alert Pain management: pain level controlled Vital Signs Assessment: post-procedure vital signs reviewed and stable Respiratory status: spontaneous breathing, nonlabored ventilation, respiratory function stable and patient connected to nasal cannula oxygen Cardiovascular status: blood pressure returned to baseline and stable Postop Assessment: no signs of nausea or vomiting Anesthetic complications: no       Last Vitals:  Vitals:   04/09/16 1735 04/09/16 1745  BP: (!) 135/104 132/84  Pulse: 83 86  Resp: 17 (!) 22  Temp: 36.3 C     Last Pain:  Vitals:   04/09/16 1745  TempSrc:   PainSc: 0-No pain                 Tiajuana Amass

## 2016-04-10 ENCOUNTER — Encounter (HOSPITAL_COMMUNITY): Payer: Self-pay | Admitting: Urology

## 2016-04-11 NOTE — Addendum Note (Signed)
Addended by: Vennie Homans on: 04/11/2016 10:56 AM   Modules accepted: Orders

## 2016-04-11 NOTE — Op Note (Signed)
NAMEEMMERIC, AVETISYAN NO.:  [id number]  MEDICAL RECORD NO.:  [id number]  LOCATION:                                 FACILITY:  PHYSICIAN:  Alexis Frock, MD     DATE OF BIRTH:  1940/09/24  DATE OF PROCEDURE: 04/09/2016                               OPERATIVE REPORT   PREOPERATIVE DIAGNOSIS:  Large volume bladder cancer.  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  PROCEDURE: 1. Trans-resection of bladder tumor, volume large. 2. Bilateral retrograde pyelogram and interpretation.  ESTIMATED BLOOD LOSS:  <100 mL.  COMPLICATION:  None.  SPECIMEN: 1. Bladder tumor for permanent pathology. 2. Base of bladder tumor for permanent pathology. 3. Prostatic urethral biopsy for permanent pathology.  FINDINGS: 1. Unremarkable bilateral retrograde pyelograms. 2. Very large volume left bladder neck tumor approximately 6 cm2. 3. Likely T2b or T3 disease given deeply invasive into detrusor with     visible tumor extending well into the deep muscular fibers. 4. No evidence of bladder perforation or damage to ureteral orifices     following resection.  DRAINS:  A 22-French 3-way Foley catheter to straight drain, irrigation port plugged.  INDICATION:  Mr. Jon is very pleasant 76 year old gentleman, who is going to of gross hematuria to have a large volume bladder cancer, mostly it is left bladder neck area.  There was no hydronephrosis, no bulky adenopathy or obvious distant disease.  Options were discussed for initial management including recommended approach with transurethral resection for diagnostic and therapeutic intent.  He wished to proceed. Informed consent was obtained and placed in the medical record.  We also discussed that given the location left bladder neck, that he may require preoperative stenting.  PROCEDURE IN DETAIL:  The patient being, Jl Mandler, was verified and the procedure being  bladder tumor  with retrograde, possible stenting was confirmed. Procedure was carried out.  Time-out was performed.  Intravenous antibiotics were administered.  General LMA anesthesia was introduced. The patient was placed into a low lithotomy position.  Sterile field was created by prepping and draping the patient's penis, perineum, proximal thighs using iodine.  Next, cystourethroscopy was performed using a 22- French rigid scope with offset lens.  Inspection of the anterior and posterior urethra were unremarkable.  Inspection of bladder revealed very large volume papillary tumor at the left bladder neck area.  There were several smaller satellite lesions.  Total surface area at least 6 cm2 with operative visualization, unfortunately the left ureteral orifice was not grossly involved.  Attention was directed at retrograde pyelography.  A left ureteral orifice was cannulated with a 6-French end- hole catheter and left retrograde pyelogram was obtained.  Left retrograde pyelogram demonstrated a single left ureter with single- system left kidney.  No filling defects or narrowing noted.  Similarly, right retrograde was obtained.  Right retrograde pyelogram demonstrated a single right ureter with single-system right kidney.  No filling defects or narrowing noted.  The cystoscope was then exchanged to 26-French continuous flow resectoscope sheath and using medium-sized resectoscope loop, bipolar energy, and saline.  Very careful systematic resection was performed of the dominant left neck tumor down  to what appeared to be superficial fibromuscular stroma of the urinary bladder taking exquisite care to avoid injury to the left ureteral orifice, which did not to occur grossly.  The small satellite lesions were also resected as well, mostly posterior wall. These tumor fragments were irrigated and set aside, labeled as bladder tumor.  Next, cold cup biopsy forceps were used to obtain representative deep  sections of the area of the bladder base.  There appeared to be visually obvious tumor extending into the deep muscle fibers, likely consistent with a T2b versus T3 disease.  This was again labeled base of bladder tumor.  Additional cold cup biopsy was performed of the prostatic urethra just proximal to the area of the verumontanum, labeled prostatic urethral biopsy.  The resectoscope loop was then used to provide fulguration current to the area of resection and biopsy. Following these maneuvers, hemostasis appeared excellent.  Ureteral orifices were bilaterally visibly patent.  Photodocumentation performed. There was no evidence of perforation.  Given the large volume and relatively deep resection, it was felt that interval catheterization would be warranted.  As such, the resectoscope was exchanged for a new 22-French 3-way hematuria catheter to gravity drainage.  The irrigation port was plugged.  The patient was taken to the postanesthesia care unit in stable condition.    ______________________________ Alexis Frock, MD   ______________________________ Alexis Frock, MD    TM/MEDQ  D:  04/09/2016  T:  04/09/2016  Job:  [id number]

## 2016-04-23 ENCOUNTER — Other Ambulatory Visit: Payer: Self-pay

## 2016-04-23 ENCOUNTER — Ambulatory Visit (HOSPITAL_COMMUNITY): Payer: Medicare Other | Attending: Cardiology

## 2016-04-23 DIAGNOSIS — I081 Rheumatic disorders of both mitral and tricuspid valves: Secondary | ICD-10-CM | POA: Diagnosis not present

## 2016-04-23 DIAGNOSIS — I481 Persistent atrial fibrillation: Secondary | ICD-10-CM | POA: Diagnosis not present

## 2016-04-23 DIAGNOSIS — I4891 Unspecified atrial fibrillation: Secondary | ICD-10-CM | POA: Insufficient documentation

## 2016-04-23 DIAGNOSIS — I4819 Other persistent atrial fibrillation: Secondary | ICD-10-CM

## 2016-04-23 DIAGNOSIS — R9431 Abnormal electrocardiogram [ECG] [EKG]: Secondary | ICD-10-CM | POA: Diagnosis not present

## 2016-05-07 ENCOUNTER — Encounter: Payer: Self-pay | Admitting: Radiation Oncology

## 2016-05-16 ENCOUNTER — Encounter: Payer: Self-pay | Admitting: Oncology

## 2016-05-16 ENCOUNTER — Telehealth: Payer: Self-pay | Admitting: Oncology

## 2016-05-16 NOTE — Telephone Encounter (Signed)
Scheduled for the pt to see Dr. Alen Blew in 4/5 at 2pm. Lft vm w/the appt date and time. Letter mailed.

## 2016-05-21 ENCOUNTER — Telehealth: Payer: Self-pay | Admitting: *Deleted

## 2016-05-21 NOTE — Telephone Encounter (Signed)
Tried calling patient's home number but no answer. Was calling to remind him of his new patient appointment with Dr. Alen Blew tomorrow at 2:00 pm.

## 2016-05-22 ENCOUNTER — Ambulatory Visit (HOSPITAL_BASED_OUTPATIENT_CLINIC_OR_DEPARTMENT_OTHER): Payer: Medicare Other | Admitting: Oncology

## 2016-05-22 ENCOUNTER — Telehealth: Payer: Self-pay | Admitting: Oncology

## 2016-05-22 VITALS — BP 123/73 | HR 96 | Temp 98.2°F | Resp 17 | Ht 69.0 in | Wt 199.1 lb

## 2016-05-22 DIAGNOSIS — C678 Malignant neoplasm of overlapping sites of bladder: Secondary | ICD-10-CM

## 2016-05-22 DIAGNOSIS — Z85038 Personal history of other malignant neoplasm of large intestine: Secondary | ICD-10-CM

## 2016-05-22 MED ORDER — PROCHLORPERAZINE MALEATE 10 MG PO TABS: 10.0000 mg | ORAL_TABLET | Freq: Four times a day (QID) | ORAL | 0 refills | Status: DC | PRN

## 2016-05-22 NOTE — Telephone Encounter (Signed)
Gave patient AVS and calender per 05/22/2016 los. Unable to schedule Chemo education class same day as Rad onc appt due to appt conflict - patient aware.

## 2016-05-22 NOTE — Progress Notes (Signed)
Reason for Referral: Bladder cancer   HPI: 76 year old gentleman currently resides in [place], [place] where he lived the majority of his life. He has a history of colon cancer diagnosed 20 years ago and underwent surgical resection followed by adjuvant chemotherapy. The details of his treatment is not available to me as he was treated at an outside facility. He does have history of nephrolithiasis and started developing hematuria in January 2018. He was set evaluated by Dr. Pearson Grippe and a CT scan of the abdomen and pelvis showed an enhancing mass along the left aspect of the urinary bladder with other additional masses noted. Based on these findings he was evaluated by Dr. Tresa Moore and underwent resection of his bladder tumor on 04/09/2016. 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 His hematuria has resolved at this time. He was given the option of radical cystectomy versus bladder preserving approach utilizing radiation therapy concomitantly with chemotherapy. He was interested in bladder preservation approach given his also poor candidacy for primary surgical therapy. Currently, he reports feeling reasonably well without any other complaints. He denied any abdominal discomfort, hematuria or flank pain. He remains in reasonable performance status including driving and attends activities of daily living.  He does not report any headaches, blurry vision, syncope or seizures. He does not report any fevers, chills, sweats or weight loss. He does not report any chest pain, palpitation, orthopnea or leg edema. He does not report any cough, wheezing or hemoptysis. He does not report any nausea, vomiting or abdominal pain. He does not report any frequency, urgency or hesitancy. He does not report any skeletal complaints. Remaining review of systems unremarkable.   Past Medical History:  Diagnosis Date  . Arthritis   .  Colon cancer ([place]) 1997  . Diabetes mellitus without complication ([place])   . Dysrhythmia    newly diagnosed with afib 03/2016. Dr Percival Spanish.  Marland Kitchen History of kidney stones   . Hypertension   . Scoliosis   :  Past Surgical History:  Procedure Laterality Date  . APPENDECTOMY    . CHOLECYSTECTOMY    . COLECTOMY  1997  . CYSTOSCOPY W/ URETERAL STENT PLACEMENT N/A 04/09/2016   Procedure: CYSTOSCOPY WITH RETROGRADE PYELOGRAM/;  Surgeon: Alexis Frock, MD;  Location: [facility];  Service: Urology;  Laterality: N/A;  . TONSILLECTOMY    . TRANSURETHRAL RESECTION OF BLADDER TUMOR N/A 04/09/2016   Procedure: TRANSURETHRAL RESECTION OF BLADDER TUMOR (TURBT);  Surgeon: Alexis Frock, MD;  Location: [facility];  Service: Urology;  Laterality: N/A;  :   Current Outpatient Prescriptions:  .  atenolol (TENORMIN) 25 MG tablet, Take 25 mg by mouth daily., Disp: , Rfl:  .  lisinopril (PRINIVIL,ZESTRIL) 5 MG tablet, Take 5 mg by mouth daily., Disp: , Rfl:  .  MEGARED OMEGA-3 KRILL OIL 500 MG CAPS, Take 500 mg by mouth See admin instructions. 3-4 times a week between 12pm-2pm, Disp: , Rfl:  .  naproxen sodium (ANAPROX) 220 MG tablet, Take 440-660 mg by mouth daily as needed (for pain (seldom))., Disp: , Rfl:  .  sitaGLIPtin-metformin (JANUMET) 50-1000 MG tablet, Take 1 tablet by mouth 2 (two) times daily., Disp: , Rfl:  .  prochlorperazine (COMPAZINE) 10 MG tablet, Take 1 tablet (10 mg total) by mouth every 6 (six) hours as needed for nausea or vomiting., Disp: 30 tablet, Rfl: 0:  No Known Allergies:  Family History  Problem Relation Age of Onset  . Diabetes Mother   .  Heart attack Father 62  :  Social History   Social History  . Marital status: Married    Spouse name: N/A  . Number of children: N/A  . Years of education: N/A   Occupational History  . Not on file.   Social History Main Topics  . Smoking status: Current Some Day Smoker    Packs/day: 0.50    Years: 50.00    Types: Cigarettes  .  Smokeless tobacco: Never Used  . Alcohol use No  . Drug use: No  . Sexual activity: Not on file   Other Topics Concern  . Not on file   Social History Narrative   epworth sleepiness scale score: 5   :  Pertinent items are noted in HPI.  Exam: Blood pressure 123/73, pulse 96, temperature 98.2 F (36.8 C), temperature source Oral, resp. rate 17, height 5\' 9"  (1.753 m), weight 199 lb 1.6 oz (90.3 kg), SpO2 99 %.  ECOG 1  General appearance: alert and cooperative appeared without distress. Throat: no oral thrush or ulcers. Neck: no adenopathy Back: negative Resp: clear to auscultation bilaterally Chest wall: no tenderness Cardio: regular rate and rhythm, S1, S2 normal, no murmur, click, rub or gallop GI: soft, non-tender; bowel sounds normal; no masses,  no organomegaly Extremities: extremities normal, atraumatic, no cyanosis or edema Pulses: 2+ and symmetric Skin: Skin color, texture, turgor normal. No rashes or lesions  CBC    Component Value Date/Time   WBC 9.3 03/31/2016 1030   RBC 5.57 03/31/2016 1030   HGB 17.4 (H) 03/31/2016 1030   HGB 16.8 03/30/2011 2001   HCT 50.8 03/31/2016 1030   HCT 49.3 03/30/2011 2001   PLT 220 03/31/2016 1030   PLT 194 03/30/2011 2001   MCV 91.2 03/31/2016 1030   MCV 90 03/30/2011 2001   MCH 31.2 03/31/2016 1030   MCHC 34.3 03/31/2016 1030   RDW 14.1 03/31/2016 1030   RDW 14.1 03/30/2011 2001     Chemistry      Component Value Date/Time   NA 136 03/31/2016 1030   NA 141 03/30/2011 2001   K 5.1 03/31/2016 1030   K 3.8 03/30/2011 2001   CL 104 03/31/2016 1030   CL 103 03/30/2011 2001   CO2 24 03/31/2016 1030   CO2 28 03/30/2011 2001   BUN 18 03/31/2016 1030   BUN 13 03/30/2011 2001   CREATININE 1.35 (H) 03/31/2016 1030   CREATININE 0.91 03/30/2011 2001      Component Value Date/Time   CALCIUM 9.4 03/31/2016 1030   CALCIUM 9.0 03/30/2011 2001   ALKPHOS 57 03/30/2011 2001   AST 17 03/30/2011 2001   ALT 18 03/30/2011 2001    BILITOT 0.9 03/30/2011 2001       Assessment and Plan:   76 year old gentleman with the following issues:  1. 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 His tumor appears to be at clinically a T3. CT scan of the abdomen and pelvis did not show any evidence of disease outside of bladder including lymphadenopathy or local invasion.  The natural course of this disease was discussed today with the patient and his wife. The standard of care definitive approach would be a radical cystectomy after neoadjuvant chemotherapy. Given his previous surgery he appears to be a marginal surgical candidate and his preference is to avoid surgery if possible. Alternatively, radiation therapy with chemotherapy would be an option for him. The rationale for using platinum-based therapy with radiation therapy in the definitive treatment  for bladder cancer was discussed. Complications associated with this treatment include nausea, fatigue, myelosuppression and thrombocytopenia.  He will have consultation with Dr. Tammi Klippel and the next few weeks and will schedule chemotherapy dates depending on his radiation appointments. Carboplatin will be infused on a weekly basis for a total of 6 weeks during radiation therapy.  2. IV access: Peripheral veins will be used at this time without any need for a Port-A-Cath initially.  3. Antiemetics: Prescription for Compazine was made available to the patient.  4. History of colon cancer: He received adjuvant chemotherapy with appears to be infusional 5-FU and leucovorin and tolerated it well. No evidence of recurrent disease at this time.  5. Follow-up: Will be determined by his radiation treatment date.

## 2016-05-28 ENCOUNTER — Encounter: Payer: Self-pay | Admitting: *Deleted

## 2016-05-30 ENCOUNTER — Ambulatory Visit: Payer: Medicare Other | Admitting: Cardiology

## 2016-05-30 NOTE — Progress Notes (Signed)
GU Location of Tumor / Histology: bladder cancer  Ryan Peters has a history of nephrolithiasis and stated developing hematuria in January 2018.    Past/Anticipated interventions by urology, if any: Dr. Tresa Moore resected the bladder tumor on 04/09/16  Past/Anticipated interventions by medical oncology, if any: evaluated by Dr. Alen Blew on 05/22/16 with plans to infused carboplatin weekly for six weeks during radiotherapy  Weight changes, if any: Denies unintentional weight loss.  Bowel/Bladder complaints, if any: Hematuria has resolved. Reports nocturia x 2-3. Denies urinary frequency.  Nausea/Vomiting, if any: no  Pain issues, if any:  Reports low back pain "just above his belt line." Reports his back ache when he stands for prolonged periods. Reports taking Aleve occasionally to manage this pain. Patient believes the pain is a result of arthritis and kidney stones. Reports that he understands he has a kidney stone on the right the size of an eraser and a golf ball size one on the left.  SAFETY ISSUES:  Prior radiation? no  Pacemaker/ICD? no  Possible current pregnancy? no  Is the patient on methotrexate? no  Current Complaints / other details:  76 year old male. Resides in [place]. Married. Current some day smoker but, trying to quit.   Hx of colon cancer in 1997. Reports it was recently discovered that he has an irregular heart beat for which [name] was recommended but, Dr. Tresa Moore didn't want him to take this due to his history of hematuria.

## 2016-06-02 ENCOUNTER — Encounter: Payer: Self-pay | Admitting: Radiation Oncology

## 2016-06-02 ENCOUNTER — Ambulatory Visit
Admission: RE | Admit: 2016-06-02 | Discharge: 2016-06-02 | Disposition: A | Discharge status: Home / Self Care | Payer: Medicare Other | Source: Ambulatory Visit | Attending: Radiation Oncology | Admitting: Radiation Oncology

## 2016-06-02 DIAGNOSIS — Z9049 Acquired absence of other specified parts of digestive tract: Secondary | ICD-10-CM | POA: Insufficient documentation

## 2016-06-02 DIAGNOSIS — Z8249 Family history of ischemic heart disease and other diseases of the circulatory system: Secondary | ICD-10-CM | POA: Diagnosis not present

## 2016-06-02 DIAGNOSIS — Z87442 Personal history of urinary calculi: Secondary | ICD-10-CM | POA: Diagnosis not present

## 2016-06-02 DIAGNOSIS — Z8051 Family history of malignant neoplasm of kidney: Secondary | ICD-10-CM | POA: Insufficient documentation

## 2016-06-02 DIAGNOSIS — R911 Solitary pulmonary nodule: Secondary | ICD-10-CM | POA: Diagnosis not present

## 2016-06-02 DIAGNOSIS — Z9889 Other specified postprocedural states: Secondary | ICD-10-CM | POA: Diagnosis not present

## 2016-06-02 DIAGNOSIS — Z807 Family history of other malignant neoplasms of lymphoid, hematopoietic and related tissues: Secondary | ICD-10-CM | POA: Insufficient documentation

## 2016-06-02 DIAGNOSIS — Z85038 Personal history of other malignant neoplasm of large intestine: Secondary | ICD-10-CM | POA: Diagnosis not present

## 2016-06-02 DIAGNOSIS — Z7984 Long term (current) use of oral hypoglycemic drugs: Secondary | ICD-10-CM | POA: Diagnosis not present

## 2016-06-02 DIAGNOSIS — I4891 Unspecified atrial fibrillation: Secondary | ICD-10-CM | POA: Insufficient documentation

## 2016-06-02 DIAGNOSIS — Z79899 Other long term (current) drug therapy: Secondary | ICD-10-CM | POA: Insufficient documentation

## 2016-06-02 DIAGNOSIS — R197 Diarrhea, unspecified: Secondary | ICD-10-CM | POA: Insufficient documentation

## 2016-06-02 DIAGNOSIS — C670 Malignant neoplasm of trigone of bladder: Secondary | ICD-10-CM | POA: Diagnosis not present

## 2016-06-02 DIAGNOSIS — I10 Essential (primary) hypertension: Secondary | ICD-10-CM | POA: Insufficient documentation

## 2016-06-02 DIAGNOSIS — Z510 Encounter for antineoplastic radiation therapy: Secondary | ICD-10-CM | POA: Insufficient documentation

## 2016-06-02 DIAGNOSIS — E119 Type 2 diabetes mellitus without complications: Secondary | ICD-10-CM | POA: Insufficient documentation

## 2016-06-02 DIAGNOSIS — Z800 Family history of malignant neoplasm of digestive organs: Secondary | ICD-10-CM | POA: Insufficient documentation

## 2016-06-02 DIAGNOSIS — M419 Scoliosis, unspecified: Secondary | ICD-10-CM | POA: Diagnosis not present

## 2016-06-02 DIAGNOSIS — Z9221 Personal history of antineoplastic chemotherapy: Secondary | ICD-10-CM | POA: Diagnosis not present

## 2016-06-02 DIAGNOSIS — C689 Malignant neoplasm of urinary organ, unspecified: Secondary | ICD-10-CM

## 2016-06-02 DIAGNOSIS — Z833 Family history of diabetes mellitus: Secondary | ICD-10-CM | POA: Insufficient documentation

## 2016-06-02 DIAGNOSIS — F17210 Nicotine dependence, cigarettes, uncomplicated: Secondary | ICD-10-CM | POA: Diagnosis not present

## 2016-06-02 HISTORY — DX: Malignant neoplasm of bladder, unspecified: C67.9

## 2016-06-02 NOTE — Progress Notes (Signed)
[place]         [phone] ________________________________  Initial outpatient Consultation  Name: Ryan Peters MRN: [id number]  Date: 06/02/2016  DOB: [birth year]  REFERRING PHYSICIAN: Alexis Frock, MD  DIAGNOSIS: 76 yo man with muscle invasive bladder cancer    ICD-9-CM ICD-10-CM   1. Cancer of trigone of urinary bladder (HCC) 188.0 C67.0     HISTORY OF PRESENT ILLNESS::Ryan Peters is a 76 y.o. male who had gross hematuria on and off visible blood and debris in urine since around 2016. CT 03/13/16 with large volume bladder cancer without obvious distant disease and large volume non-obstructing stones (Rt partial stag, Lt lower pole). No pelvic adenopathy or obvious locally advanced disease.       He had TURBT with Dr. Tresa Moore on 04/09/16 showing muscle invasive cancer:    The patient has met with Dr. Alen Blew who plans to schedule weekly Carboplatin for a total of 6 weeks during radiation therapy.   Of note, the patient has a history of colon cancer in 1997. He received adjuvant chemotherapy with appears to be infusional 5-FU and leucovorin. He was also found to have an 8 mm right middle lobe pulmonary nodule on CT Abdomen Pelvis 03/13/16.   The patient is here to discuss radiation treatment options.   PREVIOUS RADIATION THERAPY: No  Past Medical History:  Diagnosis Date  . Arthritis   . Bladder cancer ([facility])   . Colon cancer ([facility]) 1997  . Diabetes mellitus without complication ([place])   . Dysrhythmia    newly diagnosed with afib 03/2016. Dr Percival Spanish.  Marland Kitchen History of kidney stones   . Hypertension   . Scoliosis   :   Past Surgical History:  Procedure Laterality Date  . APPENDECTOMY    . CHOLECYSTECTOMY    . COLECTOMY  1997  . CYSTOSCOPY W/ URETERAL STENT PLACEMENT N/A 04/09/2016   Procedure: CYSTOSCOPY WITH RETROGRADE PYELOGRAM/;  Surgeon: Alexis Frock, MD;  Location: [facility];  Service: Urology;  Laterality: N/A;  . TONSILLECTOMY    .  TRANSURETHRAL RESECTION OF BLADDER TUMOR N/A 04/09/2016   Procedure: TRANSURETHRAL RESECTION OF BLADDER TUMOR (TURBT);  Surgeon: Alexis Frock, MD;  Location: [facility];  Service: Urology;  Laterality: N/A;  :   Current Outpatient Prescriptions:  .  atenolol (TENORMIN) 25 MG tablet, Take 25 mg by mouth daily., Disp: , Rfl:  .  lisinopril (PRINIVIL,ZESTRIL) 5 MG tablet, Take 5 mg by mouth daily., Disp: , Rfl:  .  MEGARED OMEGA-3 KRILL OIL 500 MG CAPS, Take 500 mg by mouth See admin instructions. 3-4 times a week between 12pm-2pm, Disp: , Rfl:  .  naproxen sodium (ANAPROX) 220 MG tablet, Take 440-660 mg by mouth daily as needed (for pain (seldom))., Disp: , Rfl:  .  sitaGLIPtin-metformin (JANUMET) 50-1000 MG tablet, Take 1 tablet by mouth 2 (two) times daily., Disp: , Rfl:  .  prochlorperazine (COMPAZINE) 10 MG tablet, Take 1 tablet (10 mg total) by mouth every 6 (six) hours as needed for nausea or vomiting. (Patient not taking: Reported on 06/02/2016), Disp: 30 tablet, Rfl: 0:  No Known Allergies:   Family History  Problem Relation Age of Onset  . Diabetes Mother   . Heart attack Father 93  . Kidney cancer Brother 56  . Hodgkin's lymphoma Maternal Uncle   . Throat cancer Maternal Uncle   :   Social History   Social History  . Marital status: Married    Spouse name: N/A  .  Number of children: N/A  . Years of education: N/A   Occupational History  . Not on file.   Social History Main Topics  . Smoking status: Current Some Day Smoker    Packs/day: 0.25    Years: 50.00    Types: Cigarettes  . Smokeless tobacco: Never Used     Comment: Trying to quit smoking.   . Alcohol use No  . Drug use: No  . Sexual activity: Not Currently   Other Topics Concern  . Not on file   Social History Narrative   epworth sleepiness scale score: 5   : Resides in [place]. Married.   REVIEW OF SYSTEMS:  On review of systems, the patient reports that he is doing well overall. He denies any  chest pain, shortness of breath, cough, fevers, chills, night sweats, unintended weight changes. He denies any bowel disturbances, and denies abdominal pain, nausea or vomiting. He reports hematuria has resolved. He does report nocturia x 2-3. He denies urinary frequency. He reports low back pain "just above his belt line". He reports his back ache when he stands for prolonged periods. Reports taking Aleve occasionally to manage his pain. Patient believes his pain is a result of arthritis and kidney stones. Reports he understands he has a kidney stone on the right the size of an eraser and a golf ball size one on the left. He reports it was recently discovered that he has an irregular heart beat for which Xarelto was recommended but, Dr. Tresa Moore didn't want him to take this due to his history of hematuria. A complete review of systems is obtained and is otherwise negative.   PHYSICAL EXAM:  Blood pressure 135/80, pulse 96, temperature 98.2 F (36.8 C), temperature source Oral, resp. rate 18, height 5' 9"  (1.753 m), weight 201 lb 9.6 oz (91.4 kg), SpO2 100 %. In general this is a well appearing caucasian male in no acute distress. He is alert and oriented x4 and appropriate throughout the examination. HEENT reveals that the patient is normocephalic, atraumatic. EOMs are intact. PERRLA. Skin is intact without any evidence of gross lesions. Cardiovascular exam reveals a regular rate and rhythm, no clicks rubs or murmurs are auscultated. Chest is clear to auscultation bilaterally. Lymphatic assessment is performed and does not reveal any adenopathy in the cervical, supraclavicular, axillary, or inguinal chains. Abdomen has active bowel sounds in all quadrants and is intact. The abdomen is soft, non tender, non distended. Lower extremities are negative for pretibial pitting edema, deep calf tenderness, cyanosis or clubbing.  KPS = 90  100 - Normal; no complaints; no evidence of disease. 90   - Able to carry on  normal activity; minor signs or symptoms of disease. 80   - Normal activity with effort; some signs or symptoms of disease. 37   - Cares for self; unable to carry on normal activity or to do active work. 60   - Requires occasional assistance, but is able to care for most of his personal needs. 50   - Requires considerable assistance and frequent medical care. 20   - Disabled; requires special care and assistance. 37   - Severely disabled; hospital admission is indicated although death not imminent. 35   - Very sick; hospital admission necessary; active supportive treatment necessary. 10   - Moribund; fatal processes progressing rapidly. 0     - Dead  Karnofsky DA, Abelmann WH, Craver LS and Burchenal [facility] [phone]) The use of the nitrogen mustards in the palliative  treatment of carcinoma: with particular reference to bronchogenic carcinoma Cancer 1 634-56  LABORATORY DATA:  Lab Results  Component Value Date   WBC 9.3 03/31/2016   HGB 17.4 (H) 03/31/2016   HCT 50.8 03/31/2016   MCV 91.2 03/31/2016   PLT 220 03/31/2016   Lab Results  Component Value Date   NA 136 03/31/2016   K 5.1 03/31/2016   CL 104 03/31/2016   CO2 24 03/31/2016   Lab Results  Component Value Date   ALT 18 03/30/2011   AST 17 03/30/2011   ALKPHOS 57 03/30/2011   BILITOT 0.9 03/30/2011     RADIOGRAPHY: No results found.    IMPRESSION/PLAN:  1. 76 yo gentleman with muscle invasive bladder cancer. Today, we talked to the patient and family about the findings and work-up thus far.  We discussed the natural history of muscle invasive bladder cancer and general treatment, highlighting the role of radiotherapy in the management.  We discussed the available radiation techniques, and focused on the details of logistics and delivery.  We reviewed the anticipated acute and late sequelae associated with radiation in this setting.  The patient was encouraged to ask questions that we answered to the best of our ability. The  patient would like to proceed with radiation. We discussed additional maximal TURBT procedure with Dr. Tresa Moore for tumor debulking, and the patient had a strong desire to avoid that.  Given the location and risk of ureteral/urethral injury, additional resection may not be feasible. We will speak with Dr. Tresa Moore to coordinate care. The patient will be scheduled for CT simulation next week.   2. Possible genetic predisposition to cancer. Patient has a history of colon cancer in 1997 and a brother with kidney cancer. We discussed a genetic counseling referral. The patient is not interested at this time.   3. 8 mm right middle lobe pulmonary nodule. Originally seen on CT abdomen pelvis 03/13/16. He will be scheduled for CT Chest for further staging in the next week.    The above documentation reflects our direct findings during this shared patient visit     Carola Rhine, [facility]  See with   Tyler Pita, MD [place] Director and Director of Stereotactic Radiosurgery Direct Dial: [phone]  Fax: [phone] [email or web address]  Skype  [email or web address]   This document serves as a record of services personally performed by Tyler Pita, MD and Shona Simpson, PA-C. It was created on their behalf by Arlyce Harman, a trained medical scribe. The creation of this record is based on the scribe's personal observations and the provider's statements to them. This document has been checked and approved by the attending provider.

## 2016-06-02 NOTE — Progress Notes (Signed)
See progress note under physician encounter. 

## 2016-06-03 ENCOUNTER — Other Ambulatory Visit: Payer: Medicare Other

## 2016-06-03 ENCOUNTER — Encounter: Payer: Self-pay | Admitting: *Deleted

## 2016-06-05 ENCOUNTER — Telehealth: Payer: Self-pay | Admitting: *Deleted

## 2016-06-05 NOTE — Telephone Encounter (Signed)
CALLED PATIENT TO INFORM OF CT ON 06-09-16 - ARRIVAL TIME - 1:15 PM @ [facility], PT. TO BE IN NPO 4 HRS. PRIOR TO TEST, PATIENT TO HAVE SIM ON 06-13-16, LVM FOR A RETURN CALL

## 2016-06-09 ENCOUNTER — Encounter (HOSPITAL_COMMUNITY): Payer: Self-pay

## 2016-06-09 ENCOUNTER — Ambulatory Visit (HOSPITAL_COMMUNITY)
Admission: RE | Admit: 2016-06-09 | Discharge: 2016-06-09 | Disposition: A | Discharge status: Home / Self Care | Payer: Medicare Other | Source: Ambulatory Visit | Attending: Radiation Oncology | Admitting: Radiation Oncology

## 2016-06-09 ENCOUNTER — Other Ambulatory Visit: Payer: Self-pay | Admitting: *Deleted

## 2016-06-09 ENCOUNTER — Ambulatory Visit
Admission: RE | Admit: 2016-06-09 | Discharge: 2016-06-09 | Disposition: A | Discharge status: Home / Self Care | Payer: Medicare Other | Source: Ambulatory Visit | Attending: Radiation Oncology | Admitting: Radiation Oncology

## 2016-06-09 DIAGNOSIS — N186 End stage renal disease: Secondary | ICD-10-CM

## 2016-06-09 DIAGNOSIS — C670 Malignant neoplasm of trigone of bladder: Secondary | ICD-10-CM | POA: Diagnosis not present

## 2016-06-09 DIAGNOSIS — I2510 Atherosclerotic heart disease of native coronary artery without angina pectoris: Secondary | ICD-10-CM | POA: Diagnosis not present

## 2016-06-09 DIAGNOSIS — Z510 Encounter for antineoplastic radiation therapy: Secondary | ICD-10-CM | POA: Diagnosis not present

## 2016-06-09 LAB — BUN AND CREATININE (CC13)
BUN: 14.3 mg/dL (ref 7.0–26.0)
CREATININE: 1.3 mg/dL (ref 0.7–1.3)
EGFR: 54 mL/min/{1.73_m2} — ABNORMAL LOW (ref >90)

## 2016-06-09 MED ORDER — IOPAMIDOL (ISOVUE-300) INJECTION 61%
INTRAVENOUS | Status: AC
  Administered 2016-06-09: 75 mL
  Filled 2016-06-09: qty 75

## 2016-06-11 NOTE — Progress Notes (Signed)
  Radiation Oncology         [phone] ________________________________  Name: Ryan Peters MRN: [id number]  Date: 06/13/2016  DOB: December 11, 1940  SIMULATION AND TREATMENT PLANNING NOTE    ICD-9-CM ICD-10-CM   1. 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  Identity was confirmed.  All relevant records and images related to the planned course of therapy were reviewed.  The patient freely provided informed written consent to proceed with treatment after reviewing the details related to the planned course of therapy. The consent form was witnessed and verified by the simulation staff.  Then, the patient was set-up in a stable reproducible  supine position for radiation therapy.  CT images were obtained.  Surface markings were placed.  The CT images were loaded into the planning software.  Then the target and avoidance structures were contoured.  Treatment planning then occurred.  The radiation prescription was entered and confirmed.  Then, I designed and supervised the construction of a total of 5 medically necessary complex treatment devices.  I have requested : 3D Simulation  I have requested a DVH of the following structures: rectum, left femur, right femur and target zones.  I have ordered:Nutrition Consult  PLAN:  The patient will receive 64.8 Gy in 36 fractions with 45 Gy to nodes, then boost.  ________________________________  Sheral Apley. Tammi Klippel, M.D.

## 2016-06-13 ENCOUNTER — Ambulatory Visit
Admission: RE | Admit: 2016-06-13 | Discharge: 2016-06-13 | Disposition: A | Discharge status: Home / Self Care | Payer: Medicare Other | Source: Ambulatory Visit | Attending: Radiation Oncology | Admitting: Radiation Oncology

## 2016-06-13 DIAGNOSIS — C670 Malignant neoplasm of trigone of bladder: Secondary | ICD-10-CM

## 2016-06-13 DIAGNOSIS — Z510 Encounter for antineoplastic radiation therapy: Secondary | ICD-10-CM | POA: Diagnosis not present

## 2016-06-18 DIAGNOSIS — Z510 Encounter for antineoplastic radiation therapy: Secondary | ICD-10-CM | POA: Diagnosis not present

## 2016-06-19 ENCOUNTER — Telehealth: Payer: Self-pay | Admitting: *Deleted

## 2016-06-19 NOTE — Telephone Encounter (Signed)
Wife called and left a voice mail asking,"Ryan Peters is scheduled for radiation, when will he start chemotherapy? He is suppose to have chemotherapy weekly?"

## 2016-06-20 ENCOUNTER — Other Ambulatory Visit: Payer: Self-pay | Admitting: Oncology

## 2016-06-20 NOTE — Progress Notes (Signed)
START OFF PATHWAY REGIMEN - Bladder   [id number]:Carboplatin AUC=1.5 Weekly + RT:   Administer weekly:     Carboplatin   **Always confirm dose/schedule in your pharmacy ordering system**    Patient Characteristics: Pre Cystectomy, Clinical T2-T4a, N0-1, M0, Cystectomy Ineligible/Patient Refuses Cystectomy AJCC M Category: M0 AJCC N Category: N0 AJCC T Category: T2 Current evidence of distant metastases? No AJCC 8 Stage Grouping: II  Intent of Therapy: Curative Intent, Not Discussed with Patient

## 2016-06-20 NOTE — Telephone Encounter (Signed)
Please let him know that he will be contacted with his chemo appointment by scheduling. Message sent to scheduling.  He is to keep his Lab + MD on 5/10

## 2016-06-23 ENCOUNTER — Telehealth: Payer: Self-pay | Admitting: *Deleted

## 2016-06-23 ENCOUNTER — Ambulatory Visit
Admission: RE | Admit: 2016-06-23 | Discharge: 2016-06-23 | Disposition: A | Discharge status: Home / Self Care | Payer: Medicare Other | Source: Ambulatory Visit | Attending: Radiation Oncology | Admitting: Radiation Oncology

## 2016-06-23 ENCOUNTER — Encounter: Payer: Self-pay | Admitting: *Deleted

## 2016-06-23 DIAGNOSIS — Z510 Encounter for antineoplastic radiation therapy: Secondary | ICD-10-CM | POA: Diagnosis not present

## 2016-06-23 NOTE — Telephone Encounter (Signed)
Spoke with patient. Let him know that scheduling would be calling him with chemotherapy appt. Day and time. He is to keep the 06/26/16 appt for lab and dr Alen Blew. Patient verbalized understanding.

## 2016-06-24 ENCOUNTER — Ambulatory Visit
Admission: RE | Admit: 2016-06-24 | Discharge: 2016-06-24 | Disposition: A | Discharge status: Home / Self Care | Payer: Medicare Other | Source: Ambulatory Visit | Attending: Radiation Oncology | Admitting: Radiation Oncology

## 2016-06-24 DIAGNOSIS — Z510 Encounter for antineoplastic radiation therapy: Secondary | ICD-10-CM | POA: Diagnosis not present

## 2016-06-25 ENCOUNTER — Ambulatory Visit
Admission: RE | Admit: 2016-06-25 | Discharge: 2016-06-25 | Disposition: A | Discharge status: Home / Self Care | Payer: Medicare Other | Source: Ambulatory Visit | Attending: Radiation Oncology | Admitting: Radiation Oncology

## 2016-06-25 DIAGNOSIS — Z510 Encounter for antineoplastic radiation therapy: Secondary | ICD-10-CM | POA: Diagnosis not present

## 2016-06-26 ENCOUNTER — Other Ambulatory Visit (HOSPITAL_BASED_OUTPATIENT_CLINIC_OR_DEPARTMENT_OTHER): Payer: Medicare Other

## 2016-06-26 ENCOUNTER — Ambulatory Visit
Admission: RE | Admit: 2016-06-26 | Discharge: 2016-06-26 | Disposition: A | Discharge status: Home / Self Care | Payer: Medicare Other | Source: Ambulatory Visit | Attending: Radiation Oncology | Admitting: Radiation Oncology

## 2016-06-26 ENCOUNTER — Telehealth: Payer: Self-pay | Admitting: Oncology

## 2016-06-26 ENCOUNTER — Ambulatory Visit (HOSPITAL_BASED_OUTPATIENT_CLINIC_OR_DEPARTMENT_OTHER): Payer: Medicare Other | Admitting: Oncology

## 2016-06-26 VITALS — BP 122/74 | HR 98 | Temp 98.1°F | Resp 18 | Wt 197.4 lb

## 2016-06-26 DIAGNOSIS — Z85038 Personal history of other malignant neoplasm of large intestine: Secondary | ICD-10-CM

## 2016-06-26 DIAGNOSIS — C678 Malignant neoplasm of overlapping sites of bladder: Secondary | ICD-10-CM

## 2016-06-26 DIAGNOSIS — Z510 Encounter for antineoplastic radiation therapy: Secondary | ICD-10-CM | POA: Diagnosis not present

## 2016-06-26 DIAGNOSIS — C670 Malignant neoplasm of trigone of bladder: Secondary | ICD-10-CM

## 2016-06-26 LAB — COMPREHENSIVE METABOLIC PANEL
ALT: 13 U/L (ref 0–55)
AST: 13 U/L (ref 5–34)
Albumin: 4.1 g/dL (ref 3.5–5.0)
Alkaline Phosphatase: 64 U/L (ref 40–150)
Anion Gap: 10 mEq/L (ref 3–11)
BUN: 13.6 mg/dL (ref 7.0–26.0)
CALCIUM: 9.9 mg/dL (ref 8.4–10.4)
CHLORIDE: 104 meq/L (ref 98–109)
CO2: 25 meq/L (ref 22–29)
CREATININE: 1.4 mg/dL — AB (ref 0.7–1.3)
EGFR: 48 mL/min/{1.73_m2} — ABNORMAL LOW (ref >90)
Glucose: 108 mg/dl (ref 70–140)
Potassium: 4.9 mEq/L (ref 3.5–5.1)
Sodium: 139 mEq/L (ref 136–145)
Total Bilirubin: 1.3 mg/dL — ABNORMAL HIGH (ref 0.20–1.20)
Total Protein: 7.1 g/dL (ref 6.4–8.3)

## 2016-06-26 LAB — CBC WITH DIFFERENTIAL/PLATELET
BASO%: 0.4 % (ref 0.0–2.0)
Basophils Absolute: 0 10*3/uL (ref 0.0–0.1)
EOS%: 1.9 % (ref 0.0–7.0)
Eosinophils Absolute: 0.2 10*3/uL (ref 0.0–0.5)
HEMATOCRIT: 51.3 % — AB (ref 38.4–49.9)
HGB: 17.3 g/dL — ABNORMAL HIGH (ref 13.0–17.1)
LYMPH#: 1.7 10*3/uL (ref 0.9–3.3)
LYMPH%: 22.3 % (ref 14.0–49.0)
MCH: 31.6 pg (ref 27.2–33.4)
MCHC: 33.7 g/dL (ref 32.0–36.0)
MCV: 93.6 fL (ref 79.3–98.0)
MONO#: 0.5 10*3/uL (ref 0.1–0.9)
MONO%: 6.4 % (ref 0.0–14.0)
NEUT%: 69 % (ref 39.0–75.0)
NEUTROS ABS: 5.3 10*3/uL (ref 1.5–6.5)
Platelets: 192 10*3/uL (ref 140–400)
RBC: 5.48 10*6/uL (ref 4.20–5.82)
RDW: 15.8 % — ABNORMAL HIGH (ref 11.0–14.6)
WBC: 7.7 10*3/uL (ref 4.0–10.3)

## 2016-06-26 NOTE — Progress Notes (Signed)
Hematology and Oncology Follow Up Visit  Ryan Peters [id number] August 23, 1940 76 y.o. 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 He had a T3 N0 disease.  Prior Therapy: He is S/P resection of his bladder tumor on 04/09/2016. 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  Current therapy: He is receiving definitive radiation therapy with weekly carboplatin. His first infusion of carboplatin schedule 06/27/2016.  Interim History: Ryan Peters presents today for a follow-up visit. Since last visit, he reports no complications. He started radiation therapy this week and have tolerated it well so far. He remains active and continues to attends activities of daily living. He denies any hematuria, dysuria or pelvic pain. His appetite is excellent and performance status is unchanged.  He does not report any headaches, blurry vision, syncope or seizures. He does not report any fevers, chills, sweats or weight loss. He does not report any chest pain, palpitation, orthopnea or leg edema. He does not report any cough, wheezing or hemoptysis. He does not report any nausea, vomiting or abdominal pain. He does not report any frequency, urgency or hesitancy. He does not report any skeletal complaints. Remaining review of systems unremarkable.   Medications: I have reviewed the patient's current medications.  Current Outpatient Prescriptions  Medication Sig Dispense Refill  . atenolol (TENORMIN) 25 MG tablet Take 25 mg by mouth daily.    Marland Kitchen lisinopril (PRINIVIL,ZESTRIL) 5 MG tablet Take 5 mg by mouth daily.    Marland Kitchen MEGARED OMEGA-3 KRILL OIL 500 MG CAPS Take 500 mg by mouth See admin instructions. 3-4 times a week between 12pm-2pm    . naproxen sodium (ANAPROX) 220 MG tablet Take 440-660 mg by mouth  daily as needed (for pain (seldom)).    Marland Kitchen prochlorperazine (COMPAZINE) 10 MG tablet Take 1 tablet (10 mg total) by mouth every 6 (six) hours as needed for nausea or vomiting. 30 tablet 0  . sitaGLIPtin-metformin (JANUMET) 50-1000 MG tablet Take 1 tablet by mouth 2 (two) times daily.     No current facility-administered medications for this visit.      Allergies: No Known Allergies  Past Medical History, Surgical history, Social history, and Family History were reviewed and updated.  Physical Exam: Blood pressure 122/74, pulse 98, temperature 98.1 F (36.7 C), temperature source Oral, resp. rate 18, weight 197 lb 7 oz (89.6 kg), SpO2 100 %. ECOG: 0 General appearance: alert and cooperative Head: Normocephalic, without obvious abnormality Neck: no adenopathy Lymph nodes: Cervical, supraclavicular, and axillary nodes normal. Heart:regular rate and rhythm, S1, S2 normal, no murmur, click, rub or gallop Lung:chest clear, no wheezing, rales, normal symmetric air entry. Abdomin: soft, non-tender, without masses or organomegaly EXT:no erythema, induration, or nodules   Lab Results: Lab Results  Component Value Date   WBC 7.7 06/26/2016   HGB 17.3 (H) 06/26/2016   HCT 51.3 (H) 06/26/2016   MCV 93.6 06/26/2016   PLT 192 06/26/2016     Chemistry      Component Value Date/Time   NA 136 03/31/2016 1030   NA 141 03/30/2011 2001   K 5.1 03/31/2016 1030   K 3.8 03/30/2011 2001   CL 104 03/31/2016 1030   CL 103 03/30/2011 2001   CO2 24 03/31/2016 1030   CO2 28 03/30/2011 2001   BUN 14.3 06/09/2016 1301   CREATININE 1.3 06/09/2016 1301  Component Value Date/Time   CALCIUM 9.4 03/31/2016 1030   CALCIUM 9.0 03/30/2011 2001   ALKPHOS 57 03/30/2011 2001   AST 17 03/30/2011 2001   ALT 18 03/30/2011 2001   BILITOT 0.9 03/30/2011 2001      Impression and Plan:  76 year old gentleman with the following issues:  1. 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 His tumor appears to be at clinically a T3. CT scan of the abdomen and pelvis did not show any evidence of disease outside of bladder including lymphadenopathy or local invasion.  He declined radical cystectomy and has proceeded with definitive radiation therapy with weekly chemotherapy. Risks and benefits of carboplatin chemotherapy were reviewed again with comp issues include nausea, fatigue and myelosuppression. He is agreeable to proceed and will receive the first infusion on 06/27/2016. He'll receive weekly infusion for 6 weeks.  2. IV access: Peripheral veins will be used at this time.   3. Antiemetics: Prescription for Compazine was made available to the patient.  4. History of colon cancer: He received adjuvant chemotherapy with appears to be infusional 5-FU and leucovorin and tolerated it well. No evidence of recurrent disease at this time.  5. Follow-up: 06/27/2016 for his first infusion and weekly after that.   Zola Button, MD 5/10/201810:22 AM

## 2016-06-26 NOTE — Telephone Encounter (Signed)
Scheduled appt per 5/10 los. - Gave patient AVS and calender per 5/10 los.

## 2016-06-27 ENCOUNTER — Ambulatory Visit (HOSPITAL_BASED_OUTPATIENT_CLINIC_OR_DEPARTMENT_OTHER): Payer: Medicare Other

## 2016-06-27 ENCOUNTER — Ambulatory Visit
Admission: RE | Admit: 2016-06-27 | Discharge: 2016-06-27 | Disposition: A | Discharge status: Home / Self Care | Payer: Medicare Other | Source: Ambulatory Visit | Attending: Radiation Oncology | Admitting: Radiation Oncology

## 2016-06-27 VITALS — BP 119/84 | HR 97 | Temp 98.1°F | Resp 16

## 2016-06-27 DIAGNOSIS — C678 Malignant neoplasm of overlapping sites of bladder: Secondary | ICD-10-CM | POA: Diagnosis not present

## 2016-06-27 DIAGNOSIS — Z5111 Encounter for antineoplastic chemotherapy: Secondary | ICD-10-CM | POA: Diagnosis not present

## 2016-06-27 DIAGNOSIS — C670 Malignant neoplasm of trigone of bladder: Secondary | ICD-10-CM

## 2016-06-27 DIAGNOSIS — Z510 Encounter for antineoplastic radiation therapy: Secondary | ICD-10-CM | POA: Diagnosis not present

## 2016-06-27 MED ORDER — DEXAMETHASONE SODIUM PHOSPHATE 10 MG/ML IJ SOLN
10.0000 mg | Freq: Once | INTRAMUSCULAR | Status: AC
Start: 2016-06-27 — End: 2016-06-27
  Administered 2016-06-27: 10 mg via INTRAVENOUS

## 2016-06-27 MED ORDER — SODIUM CHLORIDE 0.9 % IV SOLN
Freq: Once | INTRAVENOUS | Status: AC
  Administered 2016-06-27: 11:00:00 via INTRAVENOUS

## 2016-06-27 MED ORDER — DEXAMETHASONE SODIUM PHOSPHATE 10 MG/ML IJ SOLN
INTRAMUSCULAR | Status: AC
  Filled 2016-06-27: qty 1

## 2016-06-27 MED ORDER — PALONOSETRON HCL INJECTION 0.25 MG/5ML
0.2500 mg | Freq: Once | INTRAVENOUS | Status: AC
  Administered 2016-06-27: 0.25 mg via INTRAVENOUS

## 2016-06-27 MED ORDER — PALONOSETRON HCL INJECTION 0.25 MG/5ML
INTRAVENOUS | Status: AC
  Filled 2016-06-27: qty 5

## 2016-06-27 MED ORDER — SODIUM CHLORIDE 0.9 % IV SOLN
171.6000 mg | Freq: Once | INTRAVENOUS | Status: AC
  Administered 2016-06-27: 170 mg via INTRAVENOUS
  Filled 2016-06-27: qty 17

## 2016-06-27 NOTE — Patient Instructions (Addendum)
[facility] Discharge Instructions for Patients Receiving Chemotherapy  Today you received the following chemotherapy agents: Carboplatin   To help prevent nausea and vomiting after your treatment, we encourage you to take your nausea medication as directed.    If you develop nausea and vomiting that is not controlled by your nausea medication, call the clinic.   BELOW ARE SYMPTOMS THAT SHOULD BE REPORTED IMMEDIATELY:  *FEVER GREATER THAN 100.5 F  *CHILLS WITH OR WITHOUT FEVER  NAUSEA AND VOMITING THAT IS NOT CONTROLLED WITH YOUR NAUSEA MEDICATION  *UNUSUAL SHORTNESS OF BREATH  *UNUSUAL BRUISING OR BLEEDING  TENDERNESS IN MOUTH AND THROAT WITH OR WITHOUT PRESENCE OF ULCERS  *URINARY PROBLEMS  *BOWEL PROBLEMS  UNUSUAL RASH Items with * indicate a potential emergency and should be followed up as soon as possible.  Feel free to call the clinic you have any questions or concerns. The clinic phone number is [phone].  Please show the CHEMO ALERT CARD at check-in to the Emergency Department and triage nurse.  Carboplatin injection What is this medicine? CARBOPLATIN (KAR boe pla tin) is a chemotherapy drug. It targets fast dividing cells, like cancer cells, and causes these cells to die. This medicine is used to treat ovarian cancer and many other cancers. This medicine may be used for other purposes; ask your health care provider or pharmacist if you have questions. COMMON BRAND NAME(S): Paraplatin What should I tell my health care provider before I take this medicine? They need to know if you have any of these conditions: -blood disorders -hearing problems -kidney disease -recent or ongoing radiation therapy -an unusual or allergic reaction to carboplatin, cisplatin, other chemotherapy, other medicines, foods, dyes, or preservatives -pregnant or trying to get pregnant -breast-feeding How should I use this medicine? This drug is usually given as an  infusion into a vein. It is administered in a hospital or clinic by a specially trained health care professional. Talk to your pediatrician regarding the use of this medicine in children. Special care may be needed. Overdosage: If you think you have taken too much of this medicine contact a poison control center or emergency room at once. NOTE: This medicine is only for you. Do not share this medicine with others. What if I miss a dose? It is important not to miss a dose. Call your doctor or health care professional if you are unable to keep an appointment. What may interact with this medicine? -medicines for seizures -medicines to increase blood counts like filgrastim, pegfilgrastim, sargramostim -some antibiotics like amikacin, gentamicin, neomycin, streptomycin, tobramycin -vaccines Talk to your doctor or health care professional before taking any of these medicines: -acetaminophen -aspirin -ibuprofen -ketoprofen -naproxen This list may not describe all possible interactions. Give your health care provider a list of all the medicines, herbs, non-prescription drugs, or dietary supplements you use. Also tell them if you smoke, drink alcohol, or use illegal drugs. Some items may interact with your medicine. What should I watch for while using this medicine? Your condition will be monitored carefully while you are receiving this medicine. You will need important blood work done while you are taking this medicine. This drug may make you feel generally unwell. This is not uncommon, as chemotherapy can affect healthy cells as well as cancer cells. Report any side effects. Continue your course of treatment even though you feel ill unless your doctor tells you to stop. In some cases, you may be given additional medicines to help with side effects. Follow   all directions for their use. Call your doctor or health care professional for advice if you get a fever, chills or sore throat, or other symptoms  of a cold or flu. Do not treat yourself. This drug decreases your body's ability to fight infections. Try to avoid being around people who are sick. This medicine may increase your risk to bruise or bleed. Call your doctor or health care professional if you notice any unusual bleeding. Be careful brushing and flossing your teeth or using a toothpick because you may get an infection or bleed more easily. If you have any dental work done, tell your dentist you are receiving this medicine. Avoid taking products that contain aspirin, acetaminophen, ibuprofen, naproxen, or ketoprofen unless instructed by your doctor. These medicines may hide a fever. Do not become pregnant while taking this medicine. Women should inform their doctor if they wish to become pregnant or think they might be pregnant. There is a potential for serious side effects to an unborn child. Talk to your health care professional or pharmacist for more information. Do not breast-feed an infant while taking this medicine. What side effects may I notice from receiving this medicine? Side effects that you should report to your doctor or health care professional as soon as possible: -allergic reactions like skin rash, itching or hives, swelling of the face, lips, or tongue -signs of infection - fever or chills, cough, sore throat, pain or difficulty passing urine -signs of decreased platelets or bleeding - bruising, pinpoint red spots on the skin, black, tarry stools, nosebleeds -signs of decreased red blood cells - unusually weak or tired, fainting spells, lightheadedness -breathing problems -changes in hearing -changes in vision -chest pain -high blood pressure -low blood counts - This drug may decrease the number of white blood cells, red blood cells and platelets. You may be at increased risk for infections and bleeding. -nausea and vomiting -pain, swelling, redness or irritation at the injection site -pain, tingling, numbness in the  hands or feet -problems with balance, talking, walking -trouble passing urine or change in the amount of urine Side effects that usually do not require medical attention (report to your doctor or health care professional if they continue or are bothersome): -hair loss -loss of appetite -metallic taste in the mouth or changes in taste This list may not describe all possible side effects. Call your doctor for medical advice about side effects. You may report side effects to FDA at 1-800-FDA-1088. Where should I keep my medicine? This drug is given in a hospital or clinic and will not be stored at home. NOTE: This sheet is a summary. It may not cover all possible information. If you have questions about this medicine, talk to your doctor, pharmacist, or health care provider.  2018 Elsevier/Gold Standard (2007-05-11 14:38:05)    

## 2016-06-30 ENCOUNTER — Ambulatory Visit
Admission: RE | Admit: 2016-06-30 | Discharge: 2016-06-30 | Disposition: A | Discharge status: Home / Self Care | Payer: Medicare Other | Source: Ambulatory Visit | Attending: Radiation Oncology | Admitting: Radiation Oncology

## 2016-06-30 DIAGNOSIS — Z510 Encounter for antineoplastic radiation therapy: Secondary | ICD-10-CM | POA: Diagnosis not present

## 2016-07-01 ENCOUNTER — Ambulatory Visit
Admission: RE | Admit: 2016-07-01 | Discharge: 2016-07-01 | Disposition: A | Discharge status: Home / Self Care | Payer: Medicare Other | Source: Ambulatory Visit | Attending: Radiation Oncology | Admitting: Radiation Oncology

## 2016-07-01 DIAGNOSIS — Z510 Encounter for antineoplastic radiation therapy: Secondary | ICD-10-CM | POA: Diagnosis not present

## 2016-07-02 ENCOUNTER — Ambulatory Visit
Admission: RE | Admit: 2016-07-02 | Discharge: 2016-07-02 | Disposition: A | Discharge status: Home / Self Care | Payer: Medicare Other | Source: Ambulatory Visit | Attending: Radiation Oncology | Admitting: Radiation Oncology

## 2016-07-02 DIAGNOSIS — Z510 Encounter for antineoplastic radiation therapy: Secondary | ICD-10-CM | POA: Diagnosis not present

## 2016-07-03 ENCOUNTER — Ambulatory Visit
Admission: RE | Admit: 2016-07-03 | Discharge: 2016-07-03 | Disposition: A | Discharge status: Home / Self Care | Payer: Medicare Other | Source: Ambulatory Visit | Attending: Radiation Oncology | Admitting: Radiation Oncology

## 2016-07-03 ENCOUNTER — Telehealth: Payer: Self-pay

## 2016-07-03 DIAGNOSIS — Z510 Encounter for antineoplastic radiation therapy: Secondary | ICD-10-CM | POA: Diagnosis not present

## 2016-07-03 NOTE — Telephone Encounter (Signed)
lvm this is chemotherapy f/u call.

## 2016-07-03 NOTE — Telephone Encounter (Signed)
Pt returned call. He reports he cannot even tell he received chemo. He is doing well and remains active.

## 2016-07-03 NOTE — Telephone Encounter (Signed)
-----   Message from Sinda Du, RN sent at 06/27/2016  1:22 PM EDT ----- Regarding: Dr. Alen Blew - 1st chemo f/u First carboplatin

## 2016-07-04 ENCOUNTER — Ambulatory Visit (HOSPITAL_BASED_OUTPATIENT_CLINIC_OR_DEPARTMENT_OTHER): Payer: Medicare Other

## 2016-07-04 ENCOUNTER — Ambulatory Visit
Admission: RE | Admit: 2016-07-04 | Discharge: 2016-07-04 | Disposition: A | Discharge status: Home / Self Care | Payer: Medicare Other | Source: Ambulatory Visit | Attending: Radiation Oncology | Admitting: Radiation Oncology

## 2016-07-04 ENCOUNTER — Other Ambulatory Visit (HOSPITAL_BASED_OUTPATIENT_CLINIC_OR_DEPARTMENT_OTHER): Payer: Medicare Other

## 2016-07-04 VITALS — BP 107/64 | HR 76 | Temp 98.4°F | Resp 20

## 2016-07-04 DIAGNOSIS — C670 Malignant neoplasm of trigone of bladder: Secondary | ICD-10-CM

## 2016-07-04 DIAGNOSIS — C678 Malignant neoplasm of overlapping sites of bladder: Secondary | ICD-10-CM | POA: Diagnosis not present

## 2016-07-04 DIAGNOSIS — Z5111 Encounter for antineoplastic chemotherapy: Secondary | ICD-10-CM

## 2016-07-04 DIAGNOSIS — Z510 Encounter for antineoplastic radiation therapy: Secondary | ICD-10-CM | POA: Diagnosis not present

## 2016-07-04 LAB — COMPREHENSIVE METABOLIC PANEL
ALT: 12 U/L (ref 0–55)
AST: 11 U/L (ref 5–34)
Albumin: 3.9 g/dL (ref 3.5–5.0)
Alkaline Phosphatase: 58 U/L (ref 40–150)
Anion Gap: 11 mEq/L (ref 3–11)
BILIRUBIN TOTAL: 0.97 mg/dL (ref 0.20–1.20)
BUN: 12.7 mg/dL (ref 7.0–26.0)
CHLORIDE: 106 meq/L (ref 98–109)
CO2: 20 meq/L — AB (ref 22–29)
Calcium: 9.4 mg/dL (ref 8.4–10.4)
Creatinine: 1.1 mg/dL (ref 0.7–1.3)
EGFR: 69 mL/min/{1.73_m2} — AB (ref >90)
GLUCOSE: 109 mg/dL (ref 70–140)
Potassium: 4.5 mEq/L (ref 3.5–5.1)
SODIUM: 137 meq/L (ref 136–145)
TOTAL PROTEIN: 6.6 g/dL (ref 6.4–8.3)

## 2016-07-04 LAB — CBC WITH DIFFERENTIAL/PLATELET
BASO%: 0.8 % (ref 0.0–2.0)
Basophils Absolute: 0.1 10*3/uL (ref 0.0–0.1)
EOS%: 3.5 % (ref 0.0–7.0)
Eosinophils Absolute: 0.2 10*3/uL (ref 0.0–0.5)
HCT: 48.3 % (ref 38.4–49.9)
HGB: 16.3 g/dL (ref 13.0–17.1)
LYMPH%: 12.8 % — AB (ref 14.0–49.0)
MCH: 31.7 pg (ref 27.2–33.4)
MCHC: 33.8 g/dL (ref 32.0–36.0)
MCV: 93.7 fL (ref 79.3–98.0)
MONO#: 0.6 10*3/uL (ref 0.1–0.9)
MONO%: 9 % (ref 0.0–14.0)
NEUT%: 73.9 % (ref 39.0–75.0)
NEUTROS ABS: 4.9 10*3/uL (ref 1.5–6.5)
Platelets: 132 10*3/uL — ABNORMAL LOW (ref 140–400)
RBC: 5.16 10*6/uL (ref 4.20–5.82)
RDW: 16.2 % — ABNORMAL HIGH (ref 11.0–14.6)
WBC: 6.6 10*3/uL (ref 4.0–10.3)
lymph#: 0.8 10*3/uL — ABNORMAL LOW (ref 0.9–3.3)

## 2016-07-04 MED ORDER — PALONOSETRON HCL INJECTION 0.25 MG/5ML
0.2500 mg | Freq: Once | INTRAVENOUS | Status: AC
  Administered 2016-07-04: 0.25 mg via INTRAVENOUS

## 2016-07-04 MED ORDER — DEXAMETHASONE SODIUM PHOSPHATE 10 MG/ML IJ SOLN
INTRAMUSCULAR | Status: AC
  Filled 2016-07-04: qty 1

## 2016-07-04 MED ORDER — SODIUM CHLORIDE 0.9 % IV SOLN
204.6000 mg | Freq: Once | INTRAVENOUS | Status: AC
  Administered 2016-07-04: 200 mg via INTRAVENOUS
  Filled 2016-07-04: qty 20

## 2016-07-04 MED ORDER — SODIUM CHLORIDE 0.9 % IV SOLN
Freq: Once | INTRAVENOUS | Status: AC
  Administered 2016-07-04: 13:00:00 via INTRAVENOUS

## 2016-07-04 MED ORDER — PALONOSETRON HCL INJECTION 0.25 MG/5ML
INTRAVENOUS | Status: AC
  Filled 2016-07-04: qty 5

## 2016-07-04 MED ORDER — DEXAMETHASONE SODIUM PHOSPHATE 10 MG/ML IJ SOLN
10.0000 mg | Freq: Once | INTRAMUSCULAR | Status: AC
  Administered 2016-07-04: 10 mg via INTRAVENOUS

## 2016-07-04 NOTE — Patient Instructions (Signed)
[facility] Discharge Instructions for Patients Receiving Chemotherapy  Today you received the following chemotherapy agents: Carboplatin   To help prevent nausea and vomiting after your treatment, we encourage you to take your nausea medication as directed.    If you develop nausea and vomiting that is not controlled by your nausea medication, call the clinic.   BELOW ARE SYMPTOMS THAT SHOULD BE REPORTED IMMEDIATELY:  *FEVER GREATER THAN 100.5 F  *CHILLS WITH OR WITHOUT FEVER  NAUSEA AND VOMITING THAT IS NOT CONTROLLED WITH YOUR NAUSEA MEDICATION  *UNUSUAL SHORTNESS OF BREATH  *UNUSUAL BRUISING OR BLEEDING  TENDERNESS IN MOUTH AND THROAT WITH OR WITHOUT PRESENCE OF ULCERS  *URINARY PROBLEMS  *BOWEL PROBLEMS  UNUSUAL RASH Items with * indicate a potential emergency and should be followed up as soon as possible.  Feel free to call the clinic you have any questions or concerns. The clinic phone number is [phone].  Please show the CHEMO ALERT CARD at check-in to the Emergency Department and triage nurse.  Carboplatin injection What is this medicine? CARBOPLATIN (KAR boe pla tin) is a chemotherapy drug. It targets fast dividing cells, like cancer cells, and causes these cells to die. This medicine is used to treat ovarian cancer and many other cancers. This medicine may be used for other purposes; ask your health care provider or pharmacist if you have questions. COMMON BRAND NAME(S): Paraplatin What should I tell my health care provider before I take this medicine? They need to know if you have any of these conditions: -blood disorders -hearing problems -kidney disease -recent or ongoing radiation therapy -an unusual or allergic reaction to carboplatin, cisplatin, other chemotherapy, other medicines, foods, dyes, or preservatives -pregnant or trying to get pregnant -breast-feeding How should I use this medicine? This drug is usually given as an  infusion into a vein. It is administered in a hospital or clinic by a specially trained health care professional. Talk to your pediatrician regarding the use of this medicine in children. Special care may be needed. Overdosage: If you think you have taken too much of this medicine contact a poison control center or emergency room at once. NOTE: This medicine is only for you. Do not share this medicine with others. What if I miss a dose? It is important not to miss a dose. Call your doctor or health care professional if you are unable to keep an appointment. What may interact with this medicine? -medicines for seizures -medicines to increase blood counts like filgrastim, pegfilgrastim, sargramostim -some antibiotics like amikacin, gentamicin, neomycin, streptomycin, tobramycin -vaccines Talk to your doctor or health care professional before taking any of these medicines: -acetaminophen -aspirin -ibuprofen -ketoprofen -naproxen This list may not describe all possible interactions. Give your health care provider a list of all the medicines, herbs, non-prescription drugs, or dietary supplements you use. Also tell them if you smoke, drink alcohol, or use illegal drugs. Some items may interact with your medicine. What should I watch for while using this medicine? Your condition will be monitored carefully while you are receiving this medicine. You will need important blood work done while you are taking this medicine. This drug may make you feel generally unwell. This is not uncommon, as chemotherapy can affect healthy cells as well as cancer cells. Report any side effects. Continue your course of treatment even though you feel ill unless your doctor tells you to stop. In some cases, you may be given additional medicines to help with side effects. Follow   all directions for their use. Call your doctor or health care professional for advice if you get a fever, chills or sore throat, or other symptoms  of a cold or flu. Do not treat yourself. This drug decreases your body's ability to fight infections. Try to avoid being around people who are sick. This medicine may increase your risk to bruise or bleed. Call your doctor or health care professional if you notice any unusual bleeding. Be careful brushing and flossing your teeth or using a toothpick because you may get an infection or bleed more easily. If you have any dental work done, tell your dentist you are receiving this medicine. Avoid taking products that contain aspirin, acetaminophen, ibuprofen, naproxen, or ketoprofen unless instructed by your doctor. These medicines may hide a fever. Do not become pregnant while taking this medicine. Women should inform their doctor if they wish to become pregnant or think they might be pregnant. There is a potential for serious side effects to an unborn child. Talk to your health care professional or pharmacist for more information. Do not breast-feed an infant while taking this medicine. What side effects may I notice from receiving this medicine? Side effects that you should report to your doctor or health care professional as soon as possible: -allergic reactions like skin rash, itching or hives, swelling of the face, lips, or tongue -signs of infection - fever or chills, cough, sore throat, pain or difficulty passing urine -signs of decreased platelets or bleeding - bruising, pinpoint red spots on the skin, black, tarry stools, nosebleeds -signs of decreased red blood cells - unusually weak or tired, fainting spells, lightheadedness -breathing problems -changes in hearing -changes in vision -chest pain -high blood pressure -low blood counts - This drug may decrease the number of white blood cells, red blood cells and platelets. You may be at increased risk for infections and bleeding. -nausea and vomiting -pain, swelling, redness or irritation at the injection site -pain, tingling, numbness in the  hands or feet -problems with balance, talking, walking -trouble passing urine or change in the amount of urine Side effects that usually do not require medical attention (report to your doctor or health care professional if they continue or are bothersome): -hair loss -loss of appetite -metallic taste in the mouth or changes in taste This list may not describe all possible side effects. Call your doctor for medical advice about side effects. You may report side effects to FDA at 1-800-FDA-1088. Where should I keep my medicine? This drug is given in a hospital or clinic and will not be stored at home. NOTE: This sheet is a summary. It may not cover all possible information. If you have questions about this medicine, talk to your doctor, pharmacist, or health care provider.  2018 Elsevier/Gold Standard (2007-05-11 14:38:05)    

## 2016-07-07 ENCOUNTER — Ambulatory Visit
Admission: RE | Admit: 2016-07-07 | Discharge: 2016-07-07 | Disposition: A | Discharge status: Home / Self Care | Payer: Medicare Other | Source: Ambulatory Visit | Attending: Radiation Oncology | Admitting: Radiation Oncology

## 2016-07-07 DIAGNOSIS — Z510 Encounter for antineoplastic radiation therapy: Secondary | ICD-10-CM | POA: Diagnosis not present

## 2016-07-08 ENCOUNTER — Ambulatory Visit
Admission: RE | Admit: 2016-07-08 | Discharge: 2016-07-08 | Disposition: A | Discharge status: Home / Self Care | Payer: Medicare Other | Source: Ambulatory Visit | Attending: Radiation Oncology | Admitting: Radiation Oncology

## 2016-07-08 DIAGNOSIS — Z510 Encounter for antineoplastic radiation therapy: Secondary | ICD-10-CM | POA: Diagnosis not present

## 2016-07-09 ENCOUNTER — Ambulatory Visit
Admission: RE | Admit: 2016-07-09 | Discharge: 2016-07-09 | Disposition: A | Discharge status: Home / Self Care | Payer: Medicare Other | Source: Ambulatory Visit | Attending: Radiation Oncology | Admitting: Radiation Oncology

## 2016-07-09 DIAGNOSIS — Z510 Encounter for antineoplastic radiation therapy: Secondary | ICD-10-CM | POA: Diagnosis not present

## 2016-07-10 ENCOUNTER — Ambulatory Visit
Admission: RE | Admit: 2016-07-10 | Discharge: 2016-07-10 | Disposition: A | Discharge status: Home / Self Care | Payer: Medicare Other | Source: Ambulatory Visit | Attending: Radiation Oncology | Admitting: Radiation Oncology

## 2016-07-10 DIAGNOSIS — Z510 Encounter for antineoplastic radiation therapy: Secondary | ICD-10-CM | POA: Diagnosis not present

## 2016-07-11 ENCOUNTER — Other Ambulatory Visit (HOSPITAL_BASED_OUTPATIENT_CLINIC_OR_DEPARTMENT_OTHER): Payer: Medicare Other

## 2016-07-11 ENCOUNTER — Ambulatory Visit
Admission: RE | Admit: 2016-07-11 | Discharge: 2016-07-11 | Disposition: A | Discharge status: Home / Self Care | Payer: Medicare Other | Source: Ambulatory Visit | Attending: Radiation Oncology | Admitting: Radiation Oncology

## 2016-07-11 ENCOUNTER — Ambulatory Visit (HOSPITAL_BASED_OUTPATIENT_CLINIC_OR_DEPARTMENT_OTHER): Payer: Medicare Other

## 2016-07-11 VITALS — BP 113/82 | HR 101 | Temp 97.8°F | Resp 20

## 2016-07-11 DIAGNOSIS — Z510 Encounter for antineoplastic radiation therapy: Secondary | ICD-10-CM | POA: Diagnosis not present

## 2016-07-11 DIAGNOSIS — C678 Malignant neoplasm of overlapping sites of bladder: Secondary | ICD-10-CM

## 2016-07-11 DIAGNOSIS — C670 Malignant neoplasm of trigone of bladder: Secondary | ICD-10-CM

## 2016-07-11 DIAGNOSIS — Z5111 Encounter for antineoplastic chemotherapy: Secondary | ICD-10-CM | POA: Diagnosis not present

## 2016-07-11 LAB — CBC WITH DIFFERENTIAL/PLATELET
BASO%: 0.5 % (ref 0.0–2.0)
Basophils Absolute: 0 10*3/uL (ref 0.0–0.1)
EOS%: 3.4 % (ref 0.0–7.0)
Eosinophils Absolute: 0.2 10*3/uL (ref 0.0–0.5)
HCT: 48.3 % (ref 38.4–49.9)
HGB: 16.5 g/dL (ref 13.0–17.1)
LYMPH%: 12.1 % — AB (ref 14.0–49.0)
MCH: 32.2 pg (ref 27.2–33.4)
MCHC: 34.1 g/dL (ref 32.0–36.0)
MCV: 94.6 fL (ref 79.3–98.0)
MONO#: 0.4 10*3/uL (ref 0.1–0.9)
MONO%: 8.1 % (ref 0.0–14.0)
NEUT#: 3.7 10*3/uL (ref 1.5–6.5)
NEUT%: 75.9 % — ABNORMAL HIGH (ref 39.0–75.0)
Platelets: 114 10*3/uL — ABNORMAL LOW (ref 140–400)
RBC: 5.11 10*6/uL (ref 4.20–5.82)
RDW: 16.7 % — ABNORMAL HIGH (ref 11.0–14.6)
WBC: 4.9 10*3/uL (ref 4.0–10.3)
lymph#: 0.6 10*3/uL — ABNORMAL LOW (ref 0.9–3.3)

## 2016-07-11 LAB — COMPREHENSIVE METABOLIC PANEL
ALBUMIN: 3.8 g/dL (ref 3.5–5.0)
ALK PHOS: 52 U/L (ref 40–150)
ALT: 13 U/L (ref 0–55)
ANION GAP: 10 meq/L (ref 3–11)
AST: 13 U/L (ref 5–34)
BUN: 17.3 mg/dL (ref 7.0–26.0)
CO2: 21 mEq/L — ABNORMAL LOW (ref 22–29)
Calcium: 9.4 mg/dL (ref 8.4–10.4)
Chloride: 105 mEq/L (ref 98–109)
Creatinine: 1.1 mg/dL (ref 0.7–1.3)
EGFR: 64 mL/min/{1.73_m2} — AB (ref >90)
GLUCOSE: 121 mg/dL (ref 70–140)
POTASSIUM: 4.6 meq/L (ref 3.5–5.1)
SODIUM: 135 meq/L — AB (ref 136–145)
Total Bilirubin: 0.78 mg/dL (ref 0.20–1.20)
Total Protein: 6.4 g/dL (ref 6.4–8.3)

## 2016-07-11 MED ORDER — SODIUM CHLORIDE 0.9 % IV SOLN
204.6000 mg | Freq: Once | INTRAVENOUS | Status: AC
  Administered 2016-07-11: 200 mg via INTRAVENOUS
  Filled 2016-07-11: qty 20

## 2016-07-11 MED ORDER — PALONOSETRON HCL INJECTION 0.25 MG/5ML
0.2500 mg | Freq: Once | INTRAVENOUS | Status: AC
  Administered 2016-07-11: 0.25 mg via INTRAVENOUS

## 2016-07-11 MED ORDER — SODIUM CHLORIDE 0.9 % IV SOLN
Freq: Once | INTRAVENOUS | Status: AC
  Administered 2016-07-11: 13:00:00 via INTRAVENOUS

## 2016-07-11 MED ORDER — DEXAMETHASONE SODIUM PHOSPHATE 10 MG/ML IJ SOLN
10.0000 mg | Freq: Once | INTRAMUSCULAR | Status: AC
  Administered 2016-07-11: 10 mg via INTRAVENOUS

## 2016-07-11 MED ORDER — DEXAMETHASONE SODIUM PHOSPHATE 10 MG/ML IJ SOLN
INTRAMUSCULAR | Status: AC
  Filled 2016-07-11: qty 1

## 2016-07-11 MED ORDER — PALONOSETRON HCL INJECTION 0.25 MG/5ML
INTRAVENOUS | Status: AC
  Filled 2016-07-11: qty 5

## 2016-07-11 NOTE — Patient Instructions (Signed)
[facility] Discharge Instructions for Patients Receiving Chemotherapy  Today you received the following chemotherapy agents: Carboplatin   To help prevent nausea and vomiting after your treatment, we encourage you to take your nausea medication as directed.    If you develop nausea and vomiting that is not controlled by your nausea medication, call the clinic.   BELOW ARE SYMPTOMS THAT SHOULD BE REPORTED IMMEDIATELY:  *FEVER GREATER THAN 100.5 F  *CHILLS WITH OR WITHOUT FEVER  NAUSEA AND VOMITING THAT IS NOT CONTROLLED WITH YOUR NAUSEA MEDICATION  *UNUSUAL SHORTNESS OF BREATH  *UNUSUAL BRUISING OR BLEEDING  TENDERNESS IN MOUTH AND THROAT WITH OR WITHOUT PRESENCE OF ULCERS  *URINARY PROBLEMS  *BOWEL PROBLEMS  UNUSUAL RASH Items with * indicate a potential emergency and should be followed up as soon as possible.  Feel free to call the clinic you have any questions or concerns. The clinic phone number is [phone].  Please show the CHEMO ALERT CARD at check-in to the Emergency Department and triage nurse.   

## 2016-07-15 ENCOUNTER — Ambulatory Visit
Admission: RE | Admit: 2016-07-15 | Discharge: 2016-07-15 | Disposition: A | Discharge status: Home / Self Care | Payer: Medicare Other | Source: Ambulatory Visit | Attending: Radiation Oncology | Admitting: Radiation Oncology

## 2016-07-15 DIAGNOSIS — Z510 Encounter for antineoplastic radiation therapy: Secondary | ICD-10-CM | POA: Diagnosis not present

## 2016-07-16 ENCOUNTER — Ambulatory Visit
Admission: RE | Admit: 2016-07-16 | Discharge: 2016-07-16 | Disposition: A | Discharge status: Home / Self Care | Payer: Medicare Other | Source: Ambulatory Visit | Attending: Radiation Oncology | Admitting: Radiation Oncology

## 2016-07-16 DIAGNOSIS — Z510 Encounter for antineoplastic radiation therapy: Secondary | ICD-10-CM | POA: Diagnosis not present

## 2016-07-17 ENCOUNTER — Ambulatory Visit
Admission: RE | Admit: 2016-07-17 | Discharge: 2016-07-17 | Disposition: A | Discharge status: Home / Self Care | Payer: Medicare Other | Source: Ambulatory Visit | Attending: Radiation Oncology | Admitting: Radiation Oncology

## 2016-07-17 DIAGNOSIS — Z510 Encounter for antineoplastic radiation therapy: Secondary | ICD-10-CM | POA: Diagnosis not present

## 2016-07-18 ENCOUNTER — Ambulatory Visit
Admission: RE | Admit: 2016-07-18 | Discharge: 2016-07-18 | Disposition: A | Discharge status: Home / Self Care | Payer: Medicare Other | Source: Ambulatory Visit | Attending: Radiation Oncology | Admitting: Radiation Oncology

## 2016-07-18 ENCOUNTER — Other Ambulatory Visit (HOSPITAL_BASED_OUTPATIENT_CLINIC_OR_DEPARTMENT_OTHER): Payer: Medicare Other

## 2016-07-18 ENCOUNTER — Ambulatory Visit (HOSPITAL_BASED_OUTPATIENT_CLINIC_OR_DEPARTMENT_OTHER): Payer: Medicare Other

## 2016-07-18 VITALS — BP 101/64 | HR 96 | Temp 98.0°F | Resp 17

## 2016-07-18 DIAGNOSIS — C678 Malignant neoplasm of overlapping sites of bladder: Secondary | ICD-10-CM

## 2016-07-18 DIAGNOSIS — Z5111 Encounter for antineoplastic chemotherapy: Secondary | ICD-10-CM

## 2016-07-18 DIAGNOSIS — C670 Malignant neoplasm of trigone of bladder: Secondary | ICD-10-CM

## 2016-07-18 DIAGNOSIS — Z510 Encounter for antineoplastic radiation therapy: Secondary | ICD-10-CM | POA: Diagnosis not present

## 2016-07-18 LAB — COMPREHENSIVE METABOLIC PANEL
ALT: 10 U/L (ref 0–55)
ANION GAP: 9 meq/L (ref 3–11)
AST: 11 U/L (ref 5–34)
Albumin: 3.7 g/dL (ref 3.5–5.0)
Alkaline Phosphatase: 53 U/L (ref 40–150)
BUN: 21.2 mg/dL (ref 7.0–26.0)
CHLORIDE: 107 meq/L (ref 98–109)
CO2: 19 meq/L — AB (ref 22–29)
Calcium: 9.5 mg/dL (ref 8.4–10.4)
Creatinine: 1.3 mg/dL (ref 0.7–1.3)
EGFR: 52 mL/min/{1.73_m2} — ABNORMAL LOW (ref >90)
Glucose: 114 mg/dl (ref 70–140)
POTASSIUM: 4.6 meq/L (ref 3.5–5.1)
Sodium: 135 mEq/L — ABNORMAL LOW (ref 136–145)
Total Bilirubin: 0.73 mg/dL (ref 0.20–1.20)
Total Protein: 6.3 g/dL — ABNORMAL LOW (ref 6.4–8.3)

## 2016-07-18 LAB — CBC WITH DIFFERENTIAL/PLATELET
BASO%: 0.6 % (ref 0.0–2.0)
Basophils Absolute: 0 10*3/uL (ref 0.0–0.1)
EOS%: 2.6 % (ref 0.0–7.0)
Eosinophils Absolute: 0.1 10*3/uL (ref 0.0–0.5)
HCT: 47.5 % (ref 38.4–49.9)
HGB: 16.1 g/dL (ref 13.0–17.1)
LYMPH%: 12.3 % — AB (ref 14.0–49.0)
MCH: 32.1 pg (ref 27.2–33.4)
MCHC: 33.9 g/dL (ref 32.0–36.0)
MCV: 94.6 fL (ref 79.3–98.0)
MONO#: 0.5 10*3/uL (ref 0.1–0.9)
MONO%: 11.4 % (ref 0.0–14.0)
NEUT#: 3.5 10*3/uL (ref 1.5–6.5)
NEUT%: 73.1 % (ref 39.0–75.0)
Platelets: 157 10*3/uL (ref 140–400)
RBC: 5.02 10*6/uL (ref 4.20–5.82)
RDW: 16.6 % — ABNORMAL HIGH (ref 11.0–14.6)
WBC: 4.7 10*3/uL (ref 4.0–10.3)
lymph#: 0.6 10*3/uL — ABNORMAL LOW (ref 0.9–3.3)

## 2016-07-18 MED ORDER — DEXAMETHASONE SODIUM PHOSPHATE 10 MG/ML IJ SOLN
10.0000 mg | Freq: Once | INTRAMUSCULAR | Status: AC
  Administered 2016-07-18: 10 mg via INTRAVENOUS

## 2016-07-18 MED ORDER — SODIUM CHLORIDE 0.9 % IV SOLN
Freq: Once | INTRAVENOUS | Status: AC
  Administered 2016-07-18: 11:00:00 via INTRAVENOUS

## 2016-07-18 MED ORDER — PALONOSETRON HCL INJECTION 0.25 MG/5ML
INTRAVENOUS | Status: AC
  Filled 2016-07-18: qty 5

## 2016-07-18 MED ORDER — PALONOSETRON HCL INJECTION 0.25 MG/5ML
0.2500 mg | Freq: Once | INTRAVENOUS | Status: AC
  Administered 2016-07-18: 0.25 mg via INTRAVENOUS

## 2016-07-18 MED ORDER — SODIUM CHLORIDE 0.9 % IV SOLN
180.8000 mg | Freq: Once | INTRAVENOUS | Status: AC
  Administered 2016-07-18: 180 mg via INTRAVENOUS
  Filled 2016-07-18: qty 18

## 2016-07-18 MED ORDER — DEXAMETHASONE SODIUM PHOSPHATE 10 MG/ML IJ SOLN
INTRAMUSCULAR | Status: AC
  Filled 2016-07-18: qty 1

## 2016-07-18 NOTE — Patient Instructions (Signed)
[facility] Discharge Instructions for Patients Receiving Chemotherapy  Today you received the following chemotherapy agents :  Carboplatin.  To help prevent nausea and vomiting after your treatment, we encourage you to take your nausea medication as prescribed.   If you develop nausea and vomiting that is not controlled by your nausea medication, call the clinic.   BELOW ARE SYMPTOMS THAT SHOULD BE REPORTED IMMEDIATELY:  *FEVER GREATER THAN 100.5 F  *CHILLS WITH OR WITHOUT FEVER  NAUSEA AND VOMITING THAT IS NOT CONTROLLED WITH YOUR NAUSEA MEDICATION  *UNUSUAL SHORTNESS OF BREATH  *UNUSUAL BRUISING OR BLEEDING  TENDERNESS IN MOUTH AND THROAT WITH OR WITHOUT PRESENCE OF ULCERS  *URINARY PROBLEMS  *BOWEL PROBLEMS  UNUSUAL RASH Items with * indicate a potential emergency and should be followed up as soon as possible.  Feel free to call the clinic you have any questions or concerns. The clinic phone number is [phone].  Please show the CHEMO ALERT CARD at check-in to the Emergency Department and triage nurse.   

## 2016-07-21 ENCOUNTER — Ambulatory Visit
Admission: RE | Admit: 2016-07-21 | Discharge: 2016-07-21 | Disposition: A | Discharge status: Home / Self Care | Payer: Medicare Other | Source: Ambulatory Visit | Attending: Radiation Oncology | Admitting: Radiation Oncology

## 2016-07-21 DIAGNOSIS — Z510 Encounter for antineoplastic radiation therapy: Secondary | ICD-10-CM | POA: Diagnosis not present

## 2016-07-22 ENCOUNTER — Ambulatory Visit
Admission: RE | Admit: 2016-07-22 | Discharge: 2016-07-22 | Disposition: A | Discharge status: Home / Self Care | Payer: Medicare Other | Source: Ambulatory Visit | Attending: Radiation Oncology | Admitting: Radiation Oncology

## 2016-07-22 DIAGNOSIS — Z510 Encounter for antineoplastic radiation therapy: Secondary | ICD-10-CM | POA: Diagnosis not present

## 2016-07-23 ENCOUNTER — Ambulatory Visit
Admission: RE | Admit: 2016-07-23 | Discharge: 2016-07-23 | Disposition: A | Discharge status: Home / Self Care | Payer: Medicare Other | Source: Ambulatory Visit | Attending: Radiation Oncology | Admitting: Radiation Oncology

## 2016-07-23 DIAGNOSIS — Z510 Encounter for antineoplastic radiation therapy: Secondary | ICD-10-CM | POA: Diagnosis not present

## 2016-07-24 ENCOUNTER — Other Ambulatory Visit: Payer: Self-pay | Admitting: *Deleted

## 2016-07-24 ENCOUNTER — Ambulatory Visit (HOSPITAL_BASED_OUTPATIENT_CLINIC_OR_DEPARTMENT_OTHER): Payer: Medicare Other | Admitting: Oncology

## 2016-07-24 ENCOUNTER — Ambulatory Visit
Admission: RE | Admit: 2016-07-24 | Discharge: 2016-07-24 | Disposition: A | Discharge status: Home / Self Care | Payer: Medicare Other | Source: Ambulatory Visit | Attending: Radiation Oncology | Admitting: Radiation Oncology

## 2016-07-24 ENCOUNTER — Telehealth: Payer: Self-pay | Admitting: Oncology

## 2016-07-24 VITALS — BP 115/73 | HR 62 | Temp 97.9°F | Resp 20 | Ht 69.0 in | Wt 190.8 lb

## 2016-07-24 DIAGNOSIS — C678 Malignant neoplasm of overlapping sites of bladder: Secondary | ICD-10-CM | POA: Diagnosis not present

## 2016-07-24 DIAGNOSIS — Z85038 Personal history of other malignant neoplasm of large intestine: Secondary | ICD-10-CM | POA: Diagnosis not present

## 2016-07-24 DIAGNOSIS — C670 Malignant neoplasm of trigone of bladder: Secondary | ICD-10-CM

## 2016-07-24 DIAGNOSIS — Z510 Encounter for antineoplastic radiation therapy: Secondary | ICD-10-CM | POA: Diagnosis not present

## 2016-07-24 MED ORDER — PROCHLORPERAZINE MALEATE 10 MG PO TABS: 10.0000 mg | ORAL_TABLET | Freq: Four times a day (QID) | ORAL | 3 refills | Status: DC | PRN

## 2016-07-24 NOTE — Telephone Encounter (Signed)
Gave patient avs report and appointments for June and July. Per patient 6/15 will be last treatment.

## 2016-07-24 NOTE — Progress Notes (Signed)
Hematology and Oncology Follow Up Visit  Ryan Peters [id number] [birth year] 76 y.o. 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 He had a T3 N0 disease.  Prior Therapy: He is S/P resection of his bladder tumor on 04/09/2016. 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  Current therapy: He is receiving definitive radiation therapy with weekly carboplatin. His first infusion of carboplatin given on 06/27/2016.  Interim History: Ryan Peters presents today for a Peters visit. Since last visit, he continues to receive definitive radiation therapy with carboplatin without complications. He remains active and continues to attends activities of daily living. He denies any hematuria, dysuria or pelvic pain. He denied any nausea, vomiting, neuropathy or infusion related complications. He denied any flank pain or joint pain. His appetite remains excellent.  He does not report any headaches, blurry vision, syncope or seizures. He does not report any fevers, chills, sweats or weight loss. He does not report any chest pain, palpitation, orthopnea or leg edema. He does not report any cough, wheezing or hemoptysis. He does not report any nausea, vomiting or abdominal pain. He does not report any frequency, urgency or hesitancy. He does not report any skeletal complaints. Remaining review of systems unremarkable.   Medications: I have reviewed the patient's current medications.  Current Outpatient Prescriptions  Medication Sig Dispense Refill  . atenolol (TENORMIN) 25 MG tablet Take 25 mg by mouth daily.    Marland Kitchen lisinopril (PRINIVIL,ZESTRIL) 5 MG tablet Take 5 mg by mouth daily.    Marland Kitchen MEGARED OMEGA-3 KRILL OIL 500 MG CAPS Take 500 mg by mouth See admin instructions. 3-4 times a week between  12pm-2pm    . naproxen sodium (ANAPROX) 220 MG tablet Take 440-660 mg by mouth daily as needed (for pain (seldom)).    Marland Kitchen prochlorperazine (COMPAZINE) 10 MG tablet Take 1 tablet (10 mg total) by mouth every 6 (six) hours as needed for nausea or vomiting. 30 tablet 0  . sitaGLIPtin-metformin (JANUMET) 50-1000 MG tablet Take 1 tablet by mouth 2 (two) times daily.     No current facility-administered medications for this visit.      Allergies: No Known Allergies  Past Medical History, Surgical history, Social history, and Family History were reviewed and updated.  Physical Exam: Blood pressure 115/73, pulse 62, temperature 97.9 F (36.6 C), temperature source Oral, resp. rate 20, height 5\' 9"  (1.753 m), weight 190 lb 12.8 oz (86.5 kg), SpO2 99 %. ECOG: 0 General appearance: alert and cooperative appeared without distress. Head: Normocephalic, without obvious abnormality no oral ulcers or lesions. Neck: no adenopathy Lymph nodes: Cervical, supraclavicular, and axillary nodes normal. Heart:regular rate and rhythm, S1, S2 normal, no murmur, click, rub or gallop Lung:chest clear, no wheezing, rales, normal symmetric air entry. Abdomin: soft, non-tender, without masses or organomegaly shifting dullness or ascites. EXT:no erythema, induration, or nodules   Lab Results: Lab Results  Component Value Date   WBC 4.7 07/18/2016   HGB 16.1 07/18/2016   HCT 47.5 07/18/2016   MCV 94.6 07/18/2016   PLT 157 07/18/2016     Chemistry      Component Value Date/Time   NA 135 (L) 07/18/2016 0943   K 4.6 07/18/2016 0943   CL 104 03/31/2016 1030   CL 103 03/30/2011 2001   CO2 19 (L) 07/18/2016 0943   BUN 21.2 07/18/2016 0943  CREATININE 1.3 07/18/2016 0943      Component Value Date/Time   CALCIUM 9.5 07/18/2016 0943   ALKPHOS 53 07/18/2016 0943   AST 11 07/18/2016 0943   ALT 10 07/18/2016 0943   BILITOT 0.73 07/18/2016 0943      Impression and Plan:  76 year old gentleman with the  following issues:  1. 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 His tumor appears to be at clinically a T3. CT scan of the abdomen and pelvis did not show any evidence of disease outside of bladder including lymphadenopathy or local invasion.  He declined radical cystectomy and has proceeded with definitive radiation therapy with weekly chemotherapy.  He is currently receiving carboplatin on a weekly basis without complications associated without. He has also tolerated radiation therapy without issues. His laboratory data were reviewed and no organ dysfunction noted.  Risks and benefits of continuing this therapy was discussed and he is agreeable to continue. He received total of 6 weeks of carboplatin. 2. IV access: Peripheral veins will be used at this time.   3. Antiemetics: Prescription for Compazine was made available to the patient. This was refilled today.  4. History of colon cancer: He received adjuvant chemotherapy with appears to be infusional 5-FU and leucovorin and tolerated it well. No evidence of recurrent disease at this time.  5. Peters: On weekly basis still the conclusion of his treatment. Ryan Peters will be on 09/05/2016.   [facility], MD 6/7/20189:57 AM

## 2016-07-25 ENCOUNTER — Ambulatory Visit
Admission: RE | Admit: 2016-07-25 | Discharge: 2016-07-25 | Disposition: A | Discharge status: Home / Self Care | Payer: Medicare Other | Source: Ambulatory Visit | Attending: Radiation Oncology | Admitting: Radiation Oncology

## 2016-07-25 ENCOUNTER — Ambulatory Visit (HOSPITAL_BASED_OUTPATIENT_CLINIC_OR_DEPARTMENT_OTHER): Payer: Medicare Other

## 2016-07-25 ENCOUNTER — Other Ambulatory Visit (HOSPITAL_BASED_OUTPATIENT_CLINIC_OR_DEPARTMENT_OTHER): Payer: Medicare Other

## 2016-07-25 VITALS — BP 80/51 | HR 100 | Temp 97.8°F | Resp 20 | Ht 69.0 in

## 2016-07-25 DIAGNOSIS — Z5111 Encounter for antineoplastic chemotherapy: Secondary | ICD-10-CM | POA: Diagnosis not present

## 2016-07-25 DIAGNOSIS — C678 Malignant neoplasm of overlapping sites of bladder: Secondary | ICD-10-CM

## 2016-07-25 DIAGNOSIS — C670 Malignant neoplasm of trigone of bladder: Secondary | ICD-10-CM

## 2016-07-25 DIAGNOSIS — Z510 Encounter for antineoplastic radiation therapy: Secondary | ICD-10-CM | POA: Diagnosis not present

## 2016-07-25 LAB — CBC WITH DIFFERENTIAL/PLATELET
BASO%: 0.3 % (ref 0.0–2.0)
Basophils Absolute: 0 10*3/uL (ref 0.0–0.1)
EOS%: 0.8 % (ref 0.0–7.0)
Eosinophils Absolute: 0 10*3/uL (ref 0.0–0.5)
HCT: 47.8 % (ref 38.4–49.9)
HGB: 16 g/dL (ref 13.0–17.1)
LYMPH%: 6.9 % — AB (ref 14.0–49.0)
MCH: 31.9 pg (ref 27.2–33.4)
MCHC: 33.5 g/dL (ref 32.0–36.0)
MCV: 95.2 fL (ref 79.3–98.0)
MONO#: 0.3 10*3/uL (ref 0.1–0.9)
MONO%: 9.4 % (ref 0.0–14.0)
NEUT%: 82.6 % — AB (ref 39.0–75.0)
NEUTROS ABS: 2.7 10*3/uL (ref 1.5–6.5)
Platelets: 130 10*3/uL — ABNORMAL LOW (ref 140–400)
RBC: 5.02 10*6/uL (ref 4.20–5.82)
RDW: 17 % — ABNORMAL HIGH (ref 11.0–14.6)
WBC: 3.2 10*3/uL — AB (ref 4.0–10.3)
lymph#: 0.2 10*3/uL — ABNORMAL LOW (ref 0.9–3.3)

## 2016-07-25 LAB — COMPREHENSIVE METABOLIC PANEL
ALT: 11 U/L (ref 0–55)
AST: 12 U/L (ref 5–34)
Albumin: 3.5 g/dL (ref 3.5–5.0)
Alkaline Phosphatase: 50 U/L (ref 40–150)
Anion Gap: 10 mEq/L (ref 3–11)
BILIRUBIN TOTAL: 1.47 mg/dL — AB (ref 0.20–1.20)
BUN: 30.1 mg/dL — ABNORMAL HIGH (ref 7.0–26.0)
CO2: 21 meq/L — AB (ref 22–29)
CREATININE: 1.5 mg/dL — AB (ref 0.7–1.3)
Calcium: 10.1 mg/dL (ref 8.4–10.4)
Chloride: 103 mEq/L (ref 98–109)
EGFR: 46 mL/min/{1.73_m2} — AB (ref >90)
GLUCOSE: 155 mg/dL — AB (ref 70–140)
Potassium: 5.2 mEq/L — ABNORMAL HIGH (ref 3.5–5.1)
SODIUM: 134 meq/L — AB (ref 136–145)
TOTAL PROTEIN: 6.4 g/dL (ref 6.4–8.3)

## 2016-07-25 MED ORDER — DEXAMETHASONE SODIUM PHOSPHATE 10 MG/ML IJ SOLN
INTRAMUSCULAR | Status: AC
  Filled 2016-07-25: qty 1

## 2016-07-25 MED ORDER — PALONOSETRON HCL INJECTION 0.25 MG/5ML
INTRAVENOUS | Status: AC
  Filled 2016-07-25: qty 5

## 2016-07-25 MED ORDER — DEXAMETHASONE SODIUM PHOSPHATE 10 MG/ML IJ SOLN
10.0000 mg | Freq: Once | INTRAMUSCULAR | Status: AC
  Administered 2016-07-25: 10 mg via INTRAVENOUS

## 2016-07-25 MED ORDER — CARBOPLATIN CHEMO INJECTION 450 MG/45ML
163.4000 mg | Freq: Once | INTRAVENOUS | Status: AC
  Administered 2016-07-25: 160 mg via INTRAVENOUS
  Filled 2016-07-25: qty 16

## 2016-07-25 MED ORDER — PALONOSETRON HCL INJECTION 0.25 MG/5ML
0.2500 mg | Freq: Once | INTRAVENOUS | Status: AC
  Administered 2016-07-25: 0.25 mg via INTRAVENOUS

## 2016-07-25 MED ORDER — SODIUM CHLORIDE 0.9 % IV SOLN
Freq: Once | INTRAVENOUS | Status: AC
  Administered 2016-07-25: 13:00:00 via INTRAVENOUS

## 2016-07-25 NOTE — Patient Instructions (Addendum)
[facility] Discharge Instructions for Patients Receiving Chemotherapy  Today you received the following chemotherapy agents :  Carboplatin.  To help prevent nausea and vomiting after your treatment, we encourage you to take your nausea medication as prescribed.   If you develop nausea and vomiting that is not controlled by your nausea medication, call the clinic.   BELOW ARE SYMPTOMS THAT SHOULD BE REPORTED IMMEDIATELY:  *FEVER GREATER THAN 100.5 F  *CHILLS WITH OR WITHOUT FEVER  NAUSEA AND VOMITING THAT IS NOT CONTROLLED WITH YOUR NAUSEA MEDICATION  *UNUSUAL SHORTNESS OF BREATH  *UNUSUAL BRUISING OR BLEEDING  TENDERNESS IN MOUTH AND THROAT WITH OR WITHOUT PRESENCE OF ULCERS  *URINARY PROBLEMS  *BOWEL PROBLEMS  UNUSUAL RASH Items with * indicate a potential emergency and should be followed up as soon as possible.  Feel free to call the clinic you have any questions or concerns. The clinic phone number is [phone].  Please show the [facility] at check-in to the Emergency Department and triage nurse.   HOLD  LISINOPRIL  FOR  1  WEEK .   THEN RESUME AT HALF DOSE UNTIL NEXT OFFICE VISIT.

## 2016-07-25 NOTE — Progress Notes (Signed)
Dr. Alvy Bimler reviewed CMET results today.  OK to treat per Dr. Alvy Bimler.  K+ level  5.2  Today.  Per Dr. Alvy Bimler, pt to  HOLD  Lisinopril  For  1  Week ;  Then  Resume at  Half  Dose.   Verbal and  Written instructions given to pt.

## 2016-07-28 ENCOUNTER — Ambulatory Visit
Admission: RE | Admit: 2016-07-28 | Discharge: 2016-07-28 | Disposition: A | Discharge status: Home / Self Care | Payer: Medicare Other | Source: Ambulatory Visit | Attending: Radiation Oncology | Admitting: Radiation Oncology

## 2016-07-28 DIAGNOSIS — Z510 Encounter for antineoplastic radiation therapy: Secondary | ICD-10-CM | POA: Diagnosis not present

## 2016-07-29 ENCOUNTER — Ambulatory Visit
Admission: RE | Admit: 2016-07-29 | Discharge: 2016-07-29 | Disposition: A | Discharge status: Home / Self Care | Payer: Medicare Other | Source: Ambulatory Visit | Attending: Radiation Oncology | Admitting: Radiation Oncology

## 2016-07-29 DIAGNOSIS — Z510 Encounter for antineoplastic radiation therapy: Secondary | ICD-10-CM | POA: Diagnosis not present

## 2016-07-30 ENCOUNTER — Ambulatory Visit
Admission: RE | Admit: 2016-07-30 | Discharge: 2016-07-30 | Disposition: A | Discharge status: Home / Self Care | Payer: Medicare Other | Source: Ambulatory Visit | Attending: Radiation Oncology | Admitting: Radiation Oncology

## 2016-07-30 DIAGNOSIS — Z510 Encounter for antineoplastic radiation therapy: Secondary | ICD-10-CM | POA: Diagnosis not present

## 2016-07-30 DIAGNOSIS — C670 Malignant neoplasm of trigone of bladder: Secondary | ICD-10-CM

## 2016-07-30 NOTE — Progress Notes (Signed)
   Department of Radiation Oncology  Phone:  [phone] Fax:        [phone]  Weekly Treatment Note    Name: Ryan Peters Date: 07/30/2016 MRN: [id number] DOB: [birth year]   Diagnosis:  No diagnosis found.   Current dose: 48.6 Gy  Current fraction:27   MEDICATIONS: Current Outpatient Prescriptions  Medication Sig Dispense Refill  . atenolol (TENORMIN) 25 MG tablet Take 25 mg by mouth daily.    Marland Kitchen lisinopril (PRINIVIL,ZESTRIL) 5 MG tablet Take 5 mg by mouth daily.    Marland Kitchen MEGARED OMEGA-3 KRILL OIL 500 MG CAPS Take 500 mg by mouth See admin instructions. 3-4 times a week between 12pm-2pm    . naproxen sodium (ANAPROX) 220 MG tablet Take 440-660 mg by mouth daily as needed (for pain (seldom)).    Marland Kitchen prochlorperazine (COMPAZINE) 10 MG tablet Take 1 tablet (10 mg total) by mouth every 6 (six) hours as needed for nausea or vomiting. 30 tablet 3  . sitaGLIPtin-metformin (JANUMET) 50-1000 MG tablet Take 1 tablet by mouth 2 (two) times daily.     No current facility-administered medications for this encounter.      ALLERGIES: Patient has no known allergies.   LABORATORY DATA:  Lab Results  Component Value Date   WBC 3.2 (L) 07/25/2016   HGB 16.0 07/25/2016   HCT 47.8 07/25/2016   MCV 95.2 07/25/2016   PLT 130 (L) 07/25/2016   Lab Results  Component Value Date   NA 134 (L) 07/25/2016   K 5.2 (H) 07/25/2016   CL 104 03/31/2016   CO2 21 (L) 07/25/2016   Lab Results  Component Value Date   ALT 11 07/25/2016   AST 12 07/25/2016   ALKPHOS 50 07/25/2016   BILITOT 1.47 (H) 07/25/2016     NARRATIVE: Ryan Peters was seen today for weekly treatment management. The chart was checked and the patient's films were reviewed.  The patient completed 27/36 fractions to the bladder. The patient denies dysuria or hematuria. Reports diarrhea managed with imodium. Nocuria x 3-4. Reports food have a metallic taste and has a normal appetite. Denies pain or fatigue.  PHYSICAL  EXAMINATION: vitals were not taken for this visit.  BP: 105/77 Pulse: 93 Alert and oriented x3. In no distress.  ASSESSMENT: The patient is doing satisfactorily with treatment.  PLAN: We will continue with the patient's radiation treatment as planned.   This document serves as a record of services personally performed by Kyung Rudd, MD. It was created on his behalf by Darcus Austin, a trained medical scribe. The creation of this record is based on the scribe's personal observations and the provider's statements to them. This document has been checked and approved by the attending provider.

## 2016-07-31 ENCOUNTER — Ambulatory Visit
Admission: RE | Admit: 2016-07-31 | Discharge: 2016-07-31 | Disposition: A | Discharge status: Home / Self Care | Payer: Medicare Other | Source: Ambulatory Visit | Attending: Radiation Oncology | Admitting: Radiation Oncology

## 2016-07-31 DIAGNOSIS — Z510 Encounter for antineoplastic radiation therapy: Secondary | ICD-10-CM | POA: Diagnosis not present

## 2016-08-01 ENCOUNTER — Other Ambulatory Visit (HOSPITAL_BASED_OUTPATIENT_CLINIC_OR_DEPARTMENT_OTHER): Payer: Medicare Other

## 2016-08-01 ENCOUNTER — Ambulatory Visit
Admission: RE | Admit: 2016-08-01 | Discharge: 2016-08-01 | Disposition: A | Discharge status: Home / Self Care | Payer: Medicare Other | Source: Ambulatory Visit | Attending: Radiation Oncology | Admitting: Radiation Oncology

## 2016-08-01 ENCOUNTER — Ambulatory Visit (HOSPITAL_BASED_OUTPATIENT_CLINIC_OR_DEPARTMENT_OTHER): Payer: Medicare Other

## 2016-08-01 VITALS — BP 114/75 | HR 94 | Temp 97.9°F | Resp 20

## 2016-08-01 DIAGNOSIS — C678 Malignant neoplasm of overlapping sites of bladder: Secondary | ICD-10-CM

## 2016-08-01 DIAGNOSIS — Z5111 Encounter for antineoplastic chemotherapy: Secondary | ICD-10-CM

## 2016-08-01 DIAGNOSIS — Z510 Encounter for antineoplastic radiation therapy: Secondary | ICD-10-CM | POA: Diagnosis not present

## 2016-08-01 DIAGNOSIS — C670 Malignant neoplasm of trigone of bladder: Secondary | ICD-10-CM

## 2016-08-01 LAB — COMPREHENSIVE METABOLIC PANEL
ALT: 10 U/L (ref 0–55)
ANION GAP: 9 meq/L (ref 3–11)
AST: 13 U/L (ref 5–34)
Albumin: 3.4 g/dL — ABNORMAL LOW (ref 3.5–5.0)
Alkaline Phosphatase: 52 U/L (ref 40–150)
BUN: 15.6 mg/dL (ref 7.0–26.0)
CHLORIDE: 106 meq/L (ref 98–109)
CO2: 22 meq/L (ref 22–29)
Calcium: 9.8 mg/dL (ref 8.4–10.4)
Creatinine: 1.3 mg/dL (ref 0.7–1.3)
EGFR: 56 mL/min/{1.73_m2} — ABNORMAL LOW (ref >90)
Glucose: 84 mg/dl (ref 70–140)
POTASSIUM: 4.3 meq/L (ref 3.5–5.1)
Sodium: 138 mEq/L (ref 136–145)
Total Bilirubin: 0.62 mg/dL (ref 0.20–1.20)
Total Protein: 6.1 g/dL — ABNORMAL LOW (ref 6.4–8.3)

## 2016-08-01 LAB — CBC WITH DIFFERENTIAL/PLATELET
BASO%: 0.7 % (ref 0.0–2.0)
BASOS ABS: 0 10*3/uL (ref 0.0–0.1)
EOS%: 1.3 % (ref 0.0–7.0)
Eosinophils Absolute: 0 10*3/uL (ref 0.0–0.5)
HEMATOCRIT: 42.7 % (ref 38.4–49.9)
HGB: 14.4 g/dL (ref 13.0–17.1)
LYMPH#: 0.3 10*3/uL — AB (ref 0.9–3.3)
LYMPH%: 9 % — AB (ref 14.0–49.0)
MCH: 32.2 pg (ref 27.2–33.4)
MCHC: 33.7 g/dL (ref 32.0–36.0)
MCV: 95.5 fL (ref 79.3–98.0)
MONO#: 0.4 10*3/uL (ref 0.1–0.9)
MONO%: 11.8 % (ref 0.0–14.0)
NEUT#: 2.4 10*3/uL (ref 1.5–6.5)
NEUT%: 77.2 % — AB (ref 39.0–75.0)
PLATELETS: 123 10*3/uL — AB (ref 140–400)
RBC: 4.47 10*6/uL (ref 4.20–5.82)
RDW: 17.8 % — ABNORMAL HIGH (ref 11.0–14.6)
WBC: 3.2 10*3/uL — ABNORMAL LOW (ref 4.0–10.3)

## 2016-08-01 MED ORDER — SODIUM CHLORIDE 0.9 % IV SOLN
Freq: Once | INTRAVENOUS | Status: AC
  Administered 2016-08-01: 12:00:00 via INTRAVENOUS

## 2016-08-01 MED ORDER — PALONOSETRON HCL INJECTION 0.25 MG/5ML
INTRAVENOUS | Status: AC
  Filled 2016-08-01: qty 5

## 2016-08-01 MED ORDER — SODIUM CHLORIDE 0.9 % IV SOLN
180.8000 mg | Freq: Once | INTRAVENOUS | Status: AC
  Administered 2016-08-01: 180 mg via INTRAVENOUS
  Filled 2016-08-01: qty 18

## 2016-08-01 MED ORDER — DEXAMETHASONE SODIUM PHOSPHATE 10 MG/ML IJ SOLN
10.0000 mg | Freq: Once | INTRAMUSCULAR | Status: AC
  Administered 2016-08-01: 10 mg via INTRAVENOUS

## 2016-08-01 MED ORDER — PALONOSETRON HCL INJECTION 0.25 MG/5ML
0.2500 mg | Freq: Once | INTRAVENOUS | Status: AC
  Administered 2016-08-01: 0.25 mg via INTRAVENOUS

## 2016-08-01 MED ORDER — DEXAMETHASONE SODIUM PHOSPHATE 10 MG/ML IJ SOLN
INTRAMUSCULAR | Status: AC
  Filled 2016-08-01: qty 1

## 2016-08-01 NOTE — Patient Instructions (Signed)
[facility] Discharge Instructions for Patients Receiving Chemotherapy  Today you received the following chemotherapy agents :  Carboplatin.  To help prevent nausea and vomiting after your treatment, we encourage you to take your nausea medication as prescribed.   If you develop nausea and vomiting that is not controlled by your nausea medication, call the clinic.   BELOW ARE SYMPTOMS THAT SHOULD BE REPORTED IMMEDIATELY:  *FEVER GREATER THAN 100.5 F  *CHILLS WITH OR WITHOUT FEVER  NAUSEA AND VOMITING THAT IS NOT CONTROLLED WITH YOUR NAUSEA MEDICATION  *UNUSUAL SHORTNESS OF BREATH  *UNUSUAL BRUISING OR BLEEDING  TENDERNESS IN MOUTH AND THROAT WITH OR WITHOUT PRESENCE OF ULCERS  *URINARY PROBLEMS  *BOWEL PROBLEMS  UNUSUAL RASH Items with * indicate a potential emergency and should be followed up as soon as possible.  Feel free to call the clinic you have any questions or concerns. The clinic phone number is [phone].  Please show the [facility] at check-in to the Emergency Department and triage nurse.   HOLD  LISINOPRIL  FOR  1  WEEK .   THEN RESUME AT HALF DOSE UNTIL NEXT OFFICE VISIT.

## 2016-08-04 ENCOUNTER — Ambulatory Visit
Admission: RE | Admit: 2016-08-04 | Discharge: 2016-08-04 | Disposition: A | Discharge status: Home / Self Care | Payer: Medicare Other | Source: Ambulatory Visit | Attending: Radiation Oncology | Admitting: Radiation Oncology

## 2016-08-04 DIAGNOSIS — Z510 Encounter for antineoplastic radiation therapy: Secondary | ICD-10-CM | POA: Diagnosis not present

## 2016-08-05 ENCOUNTER — Ambulatory Visit
Admission: RE | Admit: 2016-08-05 | Discharge: 2016-08-05 | Disposition: A | Discharge status: Home / Self Care | Payer: Medicare Other | Source: Ambulatory Visit | Attending: Radiation Oncology | Admitting: Radiation Oncology

## 2016-08-05 DIAGNOSIS — Z510 Encounter for antineoplastic radiation therapy: Secondary | ICD-10-CM | POA: Diagnosis not present

## 2016-08-06 ENCOUNTER — Ambulatory Visit
Admission: RE | Admit: 2016-08-06 | Discharge: 2016-08-06 | Disposition: A | Discharge status: Home / Self Care | Payer: Medicare Other | Source: Ambulatory Visit | Attending: Radiation Oncology | Admitting: Radiation Oncology

## 2016-08-06 DIAGNOSIS — Z510 Encounter for antineoplastic radiation therapy: Secondary | ICD-10-CM | POA: Diagnosis not present

## 2016-08-07 ENCOUNTER — Ambulatory Visit
Admission: RE | Admit: 2016-08-07 | Discharge: 2016-08-07 | Disposition: A | Discharge status: Home / Self Care | Payer: Medicare Other | Source: Ambulatory Visit | Attending: Radiation Oncology | Admitting: Radiation Oncology

## 2016-08-07 DIAGNOSIS — Z510 Encounter for antineoplastic radiation therapy: Secondary | ICD-10-CM | POA: Diagnosis not present

## 2016-08-08 ENCOUNTER — Ambulatory Visit
Admission: RE | Admit: 2016-08-08 | Discharge: 2016-08-08 | Disposition: A | Discharge status: Home / Self Care | Payer: Medicare Other | Source: Ambulatory Visit | Attending: Radiation Oncology | Admitting: Radiation Oncology

## 2016-08-08 DIAGNOSIS — Z510 Encounter for antineoplastic radiation therapy: Secondary | ICD-10-CM | POA: Diagnosis not present

## 2016-08-11 ENCOUNTER — Ambulatory Visit
Admission: RE | Admit: 2016-08-11 | Discharge: 2016-08-11 | Disposition: A | Discharge status: Home / Self Care | Payer: Medicare Other | Source: Ambulatory Visit | Attending: Radiation Oncology | Admitting: Radiation Oncology

## 2016-08-11 DIAGNOSIS — Z510 Encounter for antineoplastic radiation therapy: Secondary | ICD-10-CM | POA: Diagnosis not present

## 2016-08-12 ENCOUNTER — Encounter: Payer: Self-pay | Admitting: Radiation Oncology

## 2016-08-12 ENCOUNTER — Ambulatory Visit
Admission: RE | Admit: 2016-08-12 | Discharge: 2016-08-12 | Disposition: A | Discharge status: Home / Self Care | Payer: Medicare Other | Source: Ambulatory Visit | Attending: Radiation Oncology | Admitting: Radiation Oncology

## 2016-08-12 DIAGNOSIS — Z510 Encounter for antineoplastic radiation therapy: Secondary | ICD-10-CM | POA: Diagnosis not present

## 2016-08-14 NOTE — Progress Notes (Signed)
  Radiation Oncology         [phone] ________________________________  Name: Ryan Peters MRN: [id number]  Date: 08/12/2016  DOB: 25-Jan-1941  End of Treatment Note  Diagnosis:   76 y.o. gentleman with muscle invasive bladder cancer  Indication for treatment:  Curative, with concurrent chemotherapy  Radiation treatment dates:   06/23/16 - 08/12/16  Site/dose:   1) Bladder: 45 Gy in 25 fractions 2) Bladder Boost: 19.8 Gy in 11 fractions  Beams/energy:   3D // 15X Photon  Narrative: The patient tolerated radiation treatment relatively well. The patient had some diarrhea, pain in his lower back as a 2/10, bilateral ankle swelling towards the end of treatment, rectal irritation, nocturia x2-4, and fatigue. He had hypotension and he denied light headedness or dizziness. He denied hematuria, dysuria, or incontinence.  Plan: The patient has completed radiation treatment. The patient will return to radiation oncology clinic for routine followup in one month. I advised him to call or return sooner if he has any questions or concerns related to his recovery or treatment. ________________________________  Sheral Apley. Tammi Klippel, M.D.   This document serves as a record of services personally performed by Tyler Pita, MD. It was created on his behalf by Darcus Austin, a trained medical scribe. The creation of this record is based on the scribe's personal observations and the provider's statements to them. This document has been checked and approved by the attending provider.

## 2016-09-05 ENCOUNTER — Ambulatory Visit (HOSPITAL_BASED_OUTPATIENT_CLINIC_OR_DEPARTMENT_OTHER): Payer: Medicare Other | Admitting: Oncology

## 2016-09-05 ENCOUNTER — Other Ambulatory Visit (HOSPITAL_BASED_OUTPATIENT_CLINIC_OR_DEPARTMENT_OTHER): Payer: Medicare Other

## 2016-09-05 VITALS — BP 119/71 | HR 91 | Temp 98.3°F | Resp 17 | Ht 69.0 in | Wt 183.1 lb

## 2016-09-05 DIAGNOSIS — C678 Malignant neoplasm of overlapping sites of bladder: Secondary | ICD-10-CM | POA: Diagnosis not present

## 2016-09-05 DIAGNOSIS — C670 Malignant neoplasm of trigone of bladder: Secondary | ICD-10-CM

## 2016-09-05 DIAGNOSIS — Z85038 Personal history of other malignant neoplasm of large intestine: Secondary | ICD-10-CM

## 2016-09-05 LAB — CBC WITH DIFFERENTIAL/PLATELET
BASO%: 0.2 % (ref 0.0–2.0)
Basophils Absolute: 0 10*3/uL (ref 0.0–0.1)
EOS ABS: 0 10*3/uL (ref 0.0–0.5)
EOS%: 0.4 % (ref 0.0–7.0)
HCT: 41.2 % (ref 38.4–49.9)
HEMOGLOBIN: 14.1 g/dL (ref 13.0–17.1)
LYMPH%: 12.3 % — AB (ref 14.0–49.0)
MCH: 33.7 pg — ABNORMAL HIGH (ref 27.2–33.4)
MCHC: 34.2 g/dL (ref 32.0–36.0)
MCV: 98.3 fL — AB (ref 79.3–98.0)
MONO#: 0.5 10*3/uL (ref 0.1–0.9)
MONO%: 10.6 % (ref 0.0–14.0)
NEUT%: 76.5 % — ABNORMAL HIGH (ref 39.0–75.0)
NEUTROS ABS: 3.6 10*3/uL (ref 1.5–6.5)
PLATELETS: 180 10*3/uL (ref 140–400)
RBC: 4.19 10*6/uL — AB (ref 4.20–5.82)
RDW: 19.9 % — AB (ref 11.0–14.6)
WBC: 4.7 10*3/uL (ref 4.0–10.3)
lymph#: 0.6 10*3/uL — ABNORMAL LOW (ref 0.9–3.3)

## 2016-09-05 LAB — COMPREHENSIVE METABOLIC PANEL
ALBUMIN: 3.8 g/dL (ref 3.5–5.0)
ALK PHOS: 49 U/L (ref 40–150)
ALT: 8 U/L (ref 0–55)
ANION GAP: 10 meq/L (ref 3–11)
AST: 11 U/L (ref 5–34)
BILIRUBIN TOTAL: 0.66 mg/dL (ref 0.20–1.20)
BUN: 13.8 mg/dL (ref 7.0–26.0)
CO2: 20 meq/L — AB (ref 22–29)
Calcium: 9.4 mg/dL (ref 8.4–10.4)
Chloride: 108 mEq/L (ref 98–109)
Creatinine: 1.2 mg/dL (ref 0.7–1.3)
EGFR: 62 mL/min/{1.73_m2} — ABNORMAL LOW (ref >90)
GLUCOSE: 98 mg/dL (ref 70–140)
Potassium: 4.6 mEq/L (ref 3.5–5.1)
SODIUM: 138 meq/L (ref 136–145)
TOTAL PROTEIN: 6.6 g/dL (ref 6.4–8.3)

## 2016-09-05 NOTE — Progress Notes (Signed)
Hematology and Oncology Follow Up Visit  Sherron Mummert [id number] 06/10/1940 76 y.o. 09/05/2016 9:52 AM Morayati, Lourdes Sledge, MDMorayati, Lourdes Sledge, MD   Principle Diagnosis: 76 year old gentleman with multifocal urothelial carcinoma of the bladder diagnosed in February 2018. He had a T3 N0 disease.  Prior Therapy: He is S/P resection of his bladder tumor on 04/09/2016. 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  Current therapy: He is receiving definitive radiation therapy with weekly carboplatin. His first infusion of carboplatin given on 06/27/2016. Therapy completed on 08/01/2016.  Interim History: Mr. Ryan Peters presents today for a follow-up visit. Since last visit, he completed definitive therapy with radiation and carboplatin without delayed complications. He denied any nausea, abdominal pain. He did report some mild diarrhea that has resolved.  He remains active and continues to attends activities of daily living. He denies any hematuria, dysuria or pelvic pain. He denied any flank pain or joint pain. His appetite remains excellent.  He does not report any headaches, blurry vision, syncope or seizures. He does not report any fevers, chills, sweats or weight loss. He does not report any chest pain, palpitation, orthopnea or leg edema. He does not report any cough, wheezing or hemoptysis. He does not report any nausea, vomiting or abdominal pain. He does not report any frequency, urgency or hesitancy. He does not report any skeletal complaints. Remaining review of systems unremarkable.   Medications: I have reviewed the patient's current medications.  Current Outpatient Prescriptions  Medication Sig Dispense Refill  . atenolol (TENORMIN) 25 MG tablet Take 25 mg by mouth daily.    Marland Kitchen lisinopril (PRINIVIL,ZESTRIL) 5 MG tablet Take 5 mg by mouth daily.    Marland Kitchen MEGARED OMEGA-3 KRILL OIL 500 MG CAPS Take 500 mg by mouth See  admin instructions. 3-4 times a week between 12pm-2pm    . naproxen sodium (ANAPROX) 220 MG tablet Take 440-660 mg by mouth daily as needed (for pain (seldom)).    Marland Kitchen prochlorperazine (COMPAZINE) 10 MG tablet Take 1 tablet (10 mg total) by mouth every 6 (six) hours as needed for nausea or vomiting. 30 tablet 3  . sitaGLIPtin-metformin (JANUMET) 50-1000 MG tablet Take 1 tablet by mouth 2 (two) times daily.     No current facility-administered medications for this visit.      Allergies: No Known Allergies  Past Medical History, Surgical history, Social history, and Family History were reviewed and updated.  Physical Exam: Blood pressure 119/71, pulse 91, temperature 98.3 F (36.8 C), temperature source Oral, resp. rate 17, height 5\' 9"  (1.753 m), weight 183 lb 1.6 oz (83.1 kg), SpO2 100 %. ECOG: 0 General appearance: Well-appearing gentleman without distress. Head: Normocephalic, without obvious abnormality no oral ulcers or thrush. Neck: no adenopathy Lymph nodes: Cervical, supraclavicular, and axillary nodes normal. Heart:regular rate and rhythm, S1, S2 normal, no murmur, click, rub or gallop Lung:chest clear, no wheezing, rales, normal symmetric air entry. Abdomin: soft, non-tender, without masses or organomegaly rebound or guarding. EXT:no erythema, induration, or nodules   Lab Results: Lab Results  Component Value Date   WBC 4.7 09/05/2016   HGB 14.1 09/05/2016   HCT 41.2 09/05/2016   MCV 98.3 (H) 09/05/2016   PLT 180 09/05/2016     Chemistry      Component Value Date/Time   NA 138 08/01/2016 1025   K 4.3 08/01/2016 1025   CL 104 03/31/2016 1030   CL 103 03/30/2011 2001   CO2 22 08/01/2016 1025  BUN 15.6 08/01/2016 1025   CREATININE 1.3 08/01/2016 1025      Component Value Date/Time   CALCIUM 9.8 08/01/2016 1025   ALKPHOS 52 08/01/2016 1025   AST 13 08/01/2016 1025   ALT 10 08/01/2016 1025   BILITOT 0.62 08/01/2016 1025      Impression and  Plan:  76 year old gentleman with the following issues:  1. 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 His tumor appears to be at clinically a T3. CT scan of the abdomen and pelvis did not show any evidence of disease outside of bladder including lymphadenopathy or local invasion.  He declined radical cystectomy and has proceeded with definitive radiation therapy with weekly chemotherapy.  He is S/P carboplatin on a weekly basis without complications associated without. He has also tolerated radiation therapy without issues. His laboratory data were reviewed and no organ dysfunction noted.  The plan at this time is to receive with observation and surveillance which will require periodic cystoscopies and imaging studies. He prefers to have that done when Dr. Tresa Moore and he is scheduled to have follow-up in the near future.  2. IV access: Peripheral veins is without complications.  3. Antiemetics: No longer reporting any nausea.  4. History of colon cancer: He received adjuvant chemotherapy with appears to be infusional 5-FU and leucovorin and tolerated it well. No evidence of recurrent disease at this time.  5. Follow-up: He deferred for surveillance at this time I will continue to follow with Dr. Tresa Moore. I would be happy to see him in the future as needed. He understands he might develop recurrent disease and might require further systemic therapy and at that time he'll require more intensive oncology follow up.   [facility], MD 7/20/20189:52 AM

## 2016-09-25 ENCOUNTER — Ambulatory Visit: Admission: RE | Admit: 2016-09-25 | Payer: Medicare Other | Source: Ambulatory Visit | Admitting: Urology

## 2017-08-24 ENCOUNTER — Ambulatory Visit (HOSPITAL_COMMUNITY)
Admission: RE | Admit: 2017-08-24 | Discharge: 2017-08-24 | Disposition: A | Discharge status: Home / Self Care | Payer: Medicare Other | Source: Ambulatory Visit | Attending: Urology | Admitting: Urology

## 2017-08-24 ENCOUNTER — Other Ambulatory Visit: Payer: Self-pay | Admitting: Urology

## 2017-08-24 DIAGNOSIS — C672 Malignant neoplasm of lateral wall of bladder: Secondary | ICD-10-CM

## 2017-09-06 ENCOUNTER — Emergency Department (HOSPITAL_COMMUNITY): Payer: Medicare Other

## 2017-09-06 ENCOUNTER — Encounter (HOSPITAL_COMMUNITY): Payer: Self-pay | Admitting: Emergency Medicine

## 2017-09-06 ENCOUNTER — Other Ambulatory Visit: Payer: Self-pay

## 2017-09-06 ENCOUNTER — Inpatient Hospital Stay (HOSPITAL_COMMUNITY)
Admission: EM | Admit: 2017-09-06 | Discharge: 2017-09-09 | DRG: 871 | Disposition: A | Discharge status: Home / Self Care | Payer: Medicare Other | Attending: Internal Medicine | Admitting: Internal Medicine

## 2017-09-06 DIAGNOSIS — I130 Hypertensive heart and chronic kidney disease with heart failure and stage 1 through stage 4 chronic kidney disease, or unspecified chronic kidney disease: Secondary | ICD-10-CM | POA: Diagnosis present

## 2017-09-06 DIAGNOSIS — I4891 Unspecified atrial fibrillation: Secondary | ICD-10-CM | POA: Diagnosis present

## 2017-09-06 DIAGNOSIS — E1121 Type 2 diabetes mellitus with diabetic nephropathy: Secondary | ICD-10-CM | POA: Diagnosis present

## 2017-09-06 DIAGNOSIS — G4700 Insomnia, unspecified: Secondary | ICD-10-CM | POA: Diagnosis present

## 2017-09-06 DIAGNOSIS — Z7902 Long term (current) use of antithrombotics/antiplatelets: Secondary | ICD-10-CM

## 2017-09-06 DIAGNOSIS — R5081 Fever presenting with conditions classified elsewhere: Secondary | ICD-10-CM | POA: Diagnosis not present

## 2017-09-06 DIAGNOSIS — E1122 Type 2 diabetes mellitus with diabetic chronic kidney disease: Secondary | ICD-10-CM | POA: Diagnosis present

## 2017-09-06 DIAGNOSIS — Z79899 Other long term (current) drug therapy: Secondary | ICD-10-CM | POA: Diagnosis not present

## 2017-09-06 DIAGNOSIS — F17210 Nicotine dependence, cigarettes, uncomplicated: Secondary | ICD-10-CM | POA: Diagnosis present

## 2017-09-06 DIAGNOSIS — B961 Klebsiella pneumoniae [K. pneumoniae] as the cause of diseases classified elsewhere: Secondary | ICD-10-CM | POA: Diagnosis present

## 2017-09-06 DIAGNOSIS — Z7982 Long term (current) use of aspirin: Secondary | ICD-10-CM

## 2017-09-06 DIAGNOSIS — M1990 Unspecified osteoarthritis, unspecified site: Secondary | ICD-10-CM | POA: Diagnosis present

## 2017-09-06 DIAGNOSIS — E43 Unspecified severe protein-calorie malnutrition: Secondary | ICD-10-CM | POA: Diagnosis present

## 2017-09-06 DIAGNOSIS — Z6828 Body mass index (BMI) 28.0-28.9, adult: Secondary | ICD-10-CM

## 2017-09-06 DIAGNOSIS — A4159 Other Gram-negative sepsis: Secondary | ICD-10-CM | POA: Diagnosis present

## 2017-09-06 DIAGNOSIS — I5022 Chronic systolic (congestive) heart failure: Secondary | ICD-10-CM | POA: Diagnosis not present

## 2017-09-06 DIAGNOSIS — N183 Chronic kidney disease, stage 3 (moderate): Secondary | ICD-10-CM | POA: Diagnosis present

## 2017-09-06 DIAGNOSIS — I420 Dilated cardiomyopathy: Secondary | ICD-10-CM | POA: Diagnosis not present

## 2017-09-06 DIAGNOSIS — Z85038 Personal history of other malignant neoplasm of large intestine: Secondary | ICD-10-CM

## 2017-09-06 DIAGNOSIS — R509 Fever, unspecified: Secondary | ICD-10-CM | POA: Diagnosis present

## 2017-09-06 DIAGNOSIS — R6510 Systemic inflammatory response syndrome (SIRS) of non-infectious origin without acute organ dysfunction: Secondary | ICD-10-CM

## 2017-09-06 DIAGNOSIS — N179 Acute kidney failure, unspecified: Secondary | ICD-10-CM | POA: Diagnosis present

## 2017-09-06 DIAGNOSIS — Z9221 Personal history of antineoplastic chemotherapy: Secondary | ICD-10-CM | POA: Diagnosis not present

## 2017-09-06 DIAGNOSIS — Z7984 Long term (current) use of oral hypoglycemic drugs: Secondary | ICD-10-CM

## 2017-09-06 DIAGNOSIS — Z923 Personal history of irradiation: Secondary | ICD-10-CM | POA: Diagnosis not present

## 2017-09-06 DIAGNOSIS — Z8551 Personal history of malignant neoplasm of bladder: Secondary | ICD-10-CM

## 2017-09-06 DIAGNOSIS — N3001 Acute cystitis with hematuria: Secondary | ICD-10-CM

## 2017-09-06 DIAGNOSIS — N390 Urinary tract infection, site not specified: Secondary | ICD-10-CM | POA: Diagnosis not present

## 2017-09-06 DIAGNOSIS — F419 Anxiety disorder, unspecified: Secondary | ICD-10-CM | POA: Diagnosis present

## 2017-09-06 DIAGNOSIS — I480 Paroxysmal atrial fibrillation: Secondary | ICD-10-CM | POA: Diagnosis present

## 2017-09-06 DIAGNOSIS — A419 Sepsis, unspecified organism: Secondary | ICD-10-CM | POA: Diagnosis not present

## 2017-09-06 HISTORY — DX: Unspecified atrial fibrillation: I48.91

## 2017-09-06 LAB — URINALYSIS, ROUTINE W REFLEX MICROSCOPIC
GLUCOSE, UA: 100 mg/dL — AB
Nitrite: POSITIVE — AB
PROTEIN: 100 mg/dL — AB
Specific Gravity, Urine: >1.03 — ABNORMAL HIGH (ref 1.005–1.030)
pH: 6 (ref 5.0–8.0)

## 2017-09-06 LAB — COMPREHENSIVE METABOLIC PANEL
ALBUMIN: 2.8 g/dL — AB (ref 3.5–5.0)
ALT: 8 U/L (ref 0–44)
ANION GAP: 7 (ref 5–15)
AST: 14 U/L — ABNORMAL LOW (ref 15–41)
Alkaline Phosphatase: 37 U/L — ABNORMAL LOW (ref 38–126)
BUN: 20 mg/dL (ref 8–23)
CHLORIDE: 104 mmol/L (ref 98–111)
CO2: 20 mmol/L — AB (ref 22–32)
CREATININE: 1.59 mg/dL — AB (ref 0.61–1.24)
Calcium: 7.7 mg/dL — ABNORMAL LOW (ref 8.9–10.3)
GFR calc Af Amer: 47 mL/min — ABNORMAL LOW (ref >60)
GFR calc non Af Amer: 41 mL/min — ABNORMAL LOW (ref >60)
Glucose, Bld: 224 mg/dL — ABNORMAL HIGH (ref 70–99)
Potassium: 3.6 mmol/L (ref 3.5–5.1)
SODIUM: 131 mmol/L — AB (ref 135–145)
Total Bilirubin: 1.1 mg/dL (ref 0.3–1.2)
Total Protein: 5.5 g/dL — ABNORMAL LOW (ref 6.5–8.1)

## 2017-09-06 LAB — CBC WITH DIFFERENTIAL/PLATELET
Basophils Absolute: 0 10*3/uL (ref 0.0–0.1)
Basophils Relative: 0 %
Eosinophils Absolute: 0 10*3/uL (ref 0.0–0.7)
Eosinophils Relative: 0 %
HEMATOCRIT: 34.9 % — AB (ref 39.0–52.0)
Hemoglobin: 11.7 g/dL — ABNORMAL LOW (ref 13.0–17.0)
LYMPHS ABS: 0.6 10*3/uL — AB (ref 0.7–4.0)
Lymphocytes Relative: 6 %
MCH: 30.3 pg (ref 26.0–34.0)
MCHC: 33.5 g/dL (ref 30.0–36.0)
MCV: 90.4 fL (ref 78.0–100.0)
MONOS PCT: 5 %
Monocytes Absolute: 0.5 10*3/uL (ref 0.1–1.0)
NEUTROS ABS: 8.8 10*3/uL — AB (ref 1.7–7.7)
NEUTROS PCT: 89 %
Platelets: 185 10*3/uL (ref 150–400)
RBC: 3.86 MIL/uL — ABNORMAL LOW (ref 4.22–5.81)
RDW: 14.4 % (ref 11.5–15.5)
WBC: 9.9 10*3/uL (ref 4.0–10.5)

## 2017-09-06 LAB — URINALYSIS, MICROSCOPIC (REFLEX): WBC, UA: >50 WBC/hpf (ref 0–5)

## 2017-09-06 LAB — LACTIC ACID, PLASMA: LACTIC ACID, VENOUS: 1.2 mmol/L (ref 0.5–1.9)

## 2017-09-06 LAB — GLUCOSE, CAPILLARY: Glucose-Capillary: 136 mg/dL — ABNORMAL HIGH (ref 70–99)

## 2017-09-06 MED ORDER — SODIUM CHLORIDE 0.9 % IV BOLUS
1000.0000 mL | Freq: Once | INTRAVENOUS | Status: AC
  Administered 2017-09-06: 1000 mL via INTRAVENOUS

## 2017-09-06 MED ORDER — SODIUM CHLORIDE 0.9 % IV SOLN
1.0000 g | INTRAVENOUS | Status: DC
  Administered 2017-09-07 – 2017-09-08 (×3): 1 g via INTRAVENOUS
  Filled 2017-09-06 (×3): qty 1

## 2017-09-06 MED ORDER — PRO-STAT SUGAR FREE PO LIQD
30.0000 mL | Freq: Two times a day (BID) | ORAL | Status: DC
  Administered 2017-09-07 – 2017-09-09 (×5): 30 mL via ORAL
  Filled 2017-09-06 (×5): qty 30

## 2017-09-06 MED ORDER — INSULIN ASPART 100 UNIT/ML ~~LOC~~ SOLN: 0.0000 [IU] | Freq: Every day | SUBCUTANEOUS | Status: DC

## 2017-09-06 MED ORDER — VANCOMYCIN HCL IN DEXTROSE 1-5 GM/200ML-% IV SOLN
1000.0000 mg | Freq: Once | INTRAVENOUS | Status: AC
  Administered 2017-09-06: 1000 mg via INTRAVENOUS
  Filled 2017-09-06: qty 200

## 2017-09-06 MED ORDER — VANCOMYCIN HCL IN DEXTROSE 1-5 GM/200ML-% IV SOLN
1000.0000 mg | INTRAVENOUS | Status: DC
  Administered 2017-09-07 – 2017-09-08 (×2): 1000 mg via INTRAVENOUS
  Filled 2017-09-06 (×2): qty 200

## 2017-09-06 MED ORDER — ENOXAPARIN SODIUM 30 MG/0.3ML ~~LOC~~ SOLN
30.0000 mg | SUBCUTANEOUS | Status: DC
  Administered 2017-09-06: 30 mg via SUBCUTANEOUS
  Filled 2017-09-06: qty 0.3

## 2017-09-06 MED ORDER — PIPERACILLIN-TAZOBACTAM 3.375 G IVPB 30 MIN
3.3750 g | Freq: Once | INTRAVENOUS | Status: AC
  Administered 2017-09-06: 3.375 g via INTRAVENOUS
  Filled 2017-09-06: qty 50

## 2017-09-06 MED ORDER — INSULIN ASPART 100 UNIT/ML ~~LOC~~ SOLN
0.0000 [IU] | Freq: Three times a day (TID) | SUBCUTANEOUS | Status: DC
  Administered 2017-09-08: 3 [IU] via SUBCUTANEOUS

## 2017-09-06 MED ORDER — CLOPIDOGREL BISULFATE 75 MG PO TABS
75.0000 mg | ORAL_TABLET | Freq: Every day | ORAL | Status: DC
  Administered 2017-09-07 – 2017-09-08 (×2): 75 mg via ORAL
  Filled 2017-09-06 (×2): qty 1

## 2017-09-06 MED ORDER — ACETAMINOPHEN 650 MG RE SUPP: 650.0000 mg | Freq: Four times a day (QID) | RECTAL | Status: DC | PRN

## 2017-09-06 MED ORDER — ATENOLOL 25 MG PO TABS
25.0000 mg | ORAL_TABLET | Freq: Two times a day (BID) | ORAL | Status: DC
  Administered 2017-09-07 (×2): 25 mg via ORAL
  Filled 2017-09-06 (×2): qty 1

## 2017-09-06 MED ORDER — SODIUM CHLORIDE 0.9 % IV SOLN
INTRAVENOUS | Status: DC
  Administered 2017-09-06 – 2017-09-09 (×5): via INTRAVENOUS

## 2017-09-06 MED ORDER — ACETAMINOPHEN 325 MG PO TABS
650.0000 mg | ORAL_TABLET | Freq: Four times a day (QID) | ORAL | Status: DC | PRN
  Administered 2017-09-07 (×2): 650 mg via ORAL
  Filled 2017-09-06 (×2): qty 2

## 2017-09-06 MED ORDER — ASPIRIN EC 81 MG PO TBEC
81.0000 mg | DELAYED_RELEASE_TABLET | Freq: Every day | ORAL | Status: DC
  Administered 2017-09-07 – 2017-09-08 (×2): 81 mg via ORAL
  Filled 2017-09-06 (×2): qty 1

## 2017-09-06 MED ORDER — SODIUM CHLORIDE 0.9 % IV BOLUS
500.0000 mL | Freq: Once | INTRAVENOUS | Status: AC
  Administered 2017-09-06: 500 mL via INTRAVENOUS

## 2017-09-06 NOTE — ED Notes (Signed)
Report attempted at this time.

## 2017-09-06 NOTE — Progress Notes (Signed)
A consult was received from an ED physician for Vancomycin & Zosyn per pharmacy dosing.  The patient's profile has been reviewed for ht/wt/allergies/indication/available labs.   A one time order has been placed for Vancomycin 1gm, Zosyn 3.375gm.  Further antibiotics/pharmacy consults should be ordered by admitting physician if indicated.                       Thank you,  Minda Ditto 09/06/2017  6:08 PM

## 2017-09-06 NOTE — ED Notes (Signed)
Bed: EF20 Expected date:  Expected time:  Means of arrival:  Comments: 77 yo code sepsis

## 2017-09-06 NOTE — ED Triage Notes (Addendum)
Per [facility], c/o weakness/light headedness. Bladder extention surgery on Monday 08/31/17 with increased incontinence and urgency since. Hx of cancer but is said to be in remission. No treatments in over a year. Hx afib and normally trends in normal range for HR, but is quite tachy on EMS truck with range from 140-160 (will recheck here). Had 1000 mg of tylenol before leaving house around 1700 to alleviate the 101.6 fever he has. Received 500cc NaCl. Current BP 106/60 and SpO2 is 96%RA. 20G in R hand, 18G in L forearm.

## 2017-09-06 NOTE — ED Provider Notes (Addendum)
[facility] DEPT Provider Note   CSN: [id number] Arrival date & time: 09/06/17  1732     History   Chief Complaint Chief Complaint  Patient presents with  . Weakness    HPI Ryan Peters is a 77 y.o. male presenting for evaluation of weakness and urinary frequency.  Patient presenting for evaluation of urinary frequency and generalized weakness.  Patient states he has a history of bladder cancer, on Monday he had a procedure with Dr. Bess Harvest from urology to get his bladder stretched.  For the past 2 to 3 days, he has been having urinary frequency and worsening weakness.  He states that when he goes to stand walk, his legs are "limber."  Wife states patient has been slightly more confused than normal.  For the past 2 days, patient has been alternating between hot and cold, has not checked his temperature.  He denies chest pain, shortness of breath, nausea, vomiting, or abnormal bowel movements.  He denies dysuria.  He has hematuria, which he was told would be normal after the procedure.  He reports a mild nonproductive cough.  He has a history of A. fib for which he is on Plavix, diabetes, hypertension.  Upon EMS arrival, patient blood pressure 95 systolic.  Patient reports baseline is 818 systolic.  1 g of Tylenol given with EMS.  HPI  Past Medical History:  Diagnosis Date  . Arthritis   . Bladder cancer ([place])   . Colon cancer ([place]) 1997  . Diabetes mellitus without complication ([place])   . Dysrhythmia    newly diagnosed with afib 03/2016. Dr Percival Spanish.  Marland Kitchen History of kidney stones   . Hypertension   . Scoliosis     Patient Active Problem List   Diagnosis Date Noted  . Fever 09/06/2017  . Acute lower UTI 09/06/2017  . 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 APPENDECTOMY    . CHOLECYSTECTOMY    . COLECTOMY  1997  . CYSTOSCOPY W/ URETERAL STENT PLACEMENT N/A 04/09/2016   Procedure:  CYSTOSCOPY WITH RETROGRADE PYELOGRAM/;  Surgeon: Alexis Frock, MD;  Location: [facility];  Service: Urology;  Laterality: N/A;  . TONSILLECTOMY    . TRANSURETHRAL RESECTION OF BLADDER TUMOR N/A 04/09/2016   Procedure: TRANSURETHRAL RESECTION OF BLADDER TUMOR (TURBT);  Surgeon: Alexis Frock, MD;  Location: [facility];  Service: Urology;  Laterality: N/A;        Home Medications    Prior to Admission medications   Medication Sig Start Date End Date Taking? Authorizing Provider  aspirin EC 81 MG tablet Take 81 mg by mouth daily.   Yes [provider]  atenolol (TENORMIN) 25 MG tablet Take 25 mg by mouth 2 (two) times daily.    Yes [provider]  clopidogrel (PLAVIX) 75 MG tablet Take 75 mg by mouth daily.   Yes [provider]  lisinopril (PRINIVIL,ZESTRIL) 5 MG tablet Take 5 mg by mouth daily.   Yes [provider]  naproxen sodium (ANAPROX) 220 MG tablet Take 440-660 mg by mouth daily as needed (for pain (seldom)).   Yes [provider]  sitaGLIPtin-metformin (JANUMET) 50-1000 MG tablet Take 1 tablet by mouth daily.    Yes [provider]  prochlorperazine (COMPAZINE) 10 MG tablet Take 1 tablet (10 mg total) by mouth every 6 (six) hours as needed for nausea or vomiting. Patient not taking: Reported on 09/06/2017 07/24/16   Alen Blew,  Mathis Dad, MD    Family History Family History  Problem Relation Age of Onset  . Diabetes Mother   . Heart attack Father 46  . Kidney cancer Brother 7  . Hodgkin's lymphoma Maternal Uncle   . Throat cancer Maternal Uncle     Social History Social History   Tobacco Use  . Smoking status: Current Some Day Smoker    Packs/day: 0.25    Years: 50.00    Pack years: 12.50    Types: Cigarettes  . Smokeless tobacco: Never Used  . Tobacco comment: Trying to quit smoking.   Substance Use Topics  . Alcohol use: No  . Drug use: No     Allergies   Patient has no known allergies.   Review of  Systems Review of Systems  Constitutional: Positive for fever.  Respiratory: Positive for cough.   Genitourinary: Positive for frequency and hematuria.  Neurological: Positive for weakness.  All other systems reviewed and are negative.    Physical Exam Updated Vital Signs BP 99/63 (BP Location: Left Arm)   Pulse 100   Temp 98.8 F (37.1 C) (Oral)   Resp (!) 22   SpO2 93%   Physical Exam  Constitutional: He is oriented to person, place, and time. He appears well-developed and well-nourished. No distress.  Elderly male, appears in no distress  HENT:  Head: Normocephalic and atraumatic.  Eyes: Pupils are equal, round, and reactive to light. Conjunctivae and EOM are normal.  Neck: Normal range of motion. Neck supple.  Cardiovascular: Intact distal pulses.  Tachycardic and irregular.  Pulmonary/Chest: Effort normal and breath sounds normal. No respiratory distress. He has no wheezes.  Clear lung sounds in all fields.  Speaking in full sentences.  Abdominal: Soft. He exhibits no distension and no mass. There is no tenderness. There is no rebound and no guarding.  No tenderness to palpation of the abdomen.  Musculoskeletal: Normal range of motion.  Neurological: He is alert and oriented to person, place, and time.  No obvious neurologic deficits.  Wife states patient is slightly more confused than baseline.  Skin: Skin is warm and dry. Capillary refill takes less than 2 seconds.  Psychiatric: He has a normal mood and affect.  Nursing note and vitals reviewed.    ED Treatments / Results  Labs (all labs ordered are listed, but only abnormal results are displayed) Labs Reviewed  COMPREHENSIVE METABOLIC PANEL - Abnormal; Notable for the following components:      Result Value   Sodium 131 (*)    CO2 20 (*)    Glucose, Bld 224 (*)    Creatinine, Ser 1.59 (*)    Calcium 7.7 (*)    Total Protein 5.5 (*)    Albumin 2.8 (*)    AST 14 (*)    Alkaline Phosphatase 37 (*)    GFR  calc non Af Amer 41 (*)    GFR calc Af Amer 47 (*)    All other components within normal limits  CBC WITH DIFFERENTIAL/PLATELET - Abnormal; Notable for the following components:   RBC 3.86 (*)    Hemoglobin 11.7 (*)    HCT 34.9 (*)    Neutro Abs 8.8 (*)    Lymphs Abs 0.6 (*)    All other components within normal limits  URINALYSIS, ROUTINE W REFLEX MICROSCOPIC - Abnormal; Notable for the following components:   APPearance CLOUDY (*)    Specific Gravity, Urine >1.030 (*)    Glucose, UA 100 (*)  Hgb urine dipstick MODERATE (*)    Bilirubin Urine SMALL (*)    Ketones, ur TRACE (*)    Protein, ur 100 (*)    Nitrite POSITIVE (*)    Leukocytes, UA MODERATE (*)    All other components within normal limits  URINALYSIS, MICROSCOPIC (REFLEX) - Abnormal; Notable for the following components:   Bacteria, UA MANY (*)    Non Squamous Epithelial PRESENT (*)    All other components within normal limits  CULTURE, BLOOD (ROUTINE X 2)  CULTURE, BLOOD (ROUTINE X 2)  URINE CULTURE  LACTIC ACID, PLASMA    EKG EKG Interpretation  Date/Time:  Sunday September 06 2017 17:48:33 EDT Ventricular Rate:  121 PR Interval:    QRS Duration: 160 QT Interval:  330 QTC Calculation: 469 R Axis:   -85 Text Interpretation:  Atrial fibrillation RBBB and LAFB Confirmed by Dene Gentry [id number]) on 09/06/2017 6:51:33 PM   Radiology Dg Chest 2 View  Result Date: 09/06/2017 CLINICAL DATA:  Cough and fever EXAM: CHEST - 2 VIEW COMPARISON:  08/24/2017 FINDINGS: The heart size and mediastinal contours are within normal limits. Both lungs are clear. The visualized skeletal structures are unremarkable. IMPRESSION: No active cardiopulmonary disease. Electronically Signed   By: Ulyses Jarred M.D.   On: 09/06/2017 19:09    Procedures .Critical Care Performed by: Franchot Heidelberg, PA-C Authorized by: Franchot Heidelberg, PA-C   Critical care provider statement:    Critical care time (minutes):  40   Critical care  time was exclusive of:  Separately billable procedures and treating other patients and teaching time   Critical care was necessary to treat or prevent imminent or life-threatening deterioration of the following conditions:  Sepsis   Critical care was time spent personally by me on the following activities:  Blood draw for specimens, development of treatment plan with patient or surrogate, discussions with consultants, evaluation of patient's response to treatment, examination of patient, obtaining history from patient or surrogate, ordering and performing treatments and interventions, ordering and review of laboratory studies, ordering and review of radiographic studies, pulse oximetry, re-evaluation of patient's condition and review of old charts   I assumed direction of critical care for this patient from another provider in my specialty: no   Comments:     Pt presenting meetings SIRS criteria. In afib with mild tachycardia. BP soft.  abx and fluids started. Pt admitted.    (including critical care time)  Medications Ordered in ED Medications  sodium chloride 0.9 % bolus 1,000 mL (1,000 mLs Intravenous New Bag/Given 09/06/17 2003)  piperacillin-tazobactam (ZOSYN) IVPB 3.375 g (0 g Intravenous Stopped 09/06/17 1904)  vancomycin (VANCOCIN) IVPB 1000 mg/200 mL premix (0 mg Intravenous Stopped 09/06/17 2004)  sodium chloride 0.9 % bolus 500 mL (0 mLs Intravenous Stopped 09/06/17 1904)     Initial Impression / Assessment and Plan / ED Course  I have reviewed the triage vital signs and the nursing notes.  Pertinent labs & imaging results that were available during my care of the patient were reviewed by me and considered in my medical decision making (see chart for details).     Patient presenting for evaluation of weakness and urinary frequency.  Physical exam shows patient who is febrile, tachycardic in A. fib, with a soft blood pressure.  Does not appear in acute distress however.  Concern for  sepsis, code sepsis called.  Fluids and antibiotics started.  Will obtain labs, urine, and chest x-ray for further evaluation..  Patient was Artie  given Tylenol prior to arrival, will not give any further antipyretics at this time.  Labs reassuring in that patient with a negative lactic and no white count.  Wrist x-ray viewed interpreted by me, no pneumonia, pneumothorax, or effusions.  UA pending.  Heart rate improving with fluids and antibiotics.  Blood pressure remains soft.  Temperature improved.  Urine shows obvious infection.  Patient with UTI meeting Sirs criteria.  In A. fib with tachycardia.  Case discussed with attending, Dr. Francia Greaves evaluated patient.  Will call for admission.  Discussed with Dr. Maudie Mercury from [facility] hospitalist service, patient be admitted.   Final Clinical Impressions(s) / ED Diagnoses   Final diagnoses:  Acute cystitis with hematuria  SIRS (systemic inflammatory response syndrome) ([place])  Atrial fibrillation, unspecified type [facility])    ED Discharge Orders    None       Franchot Heidelberg, PA-C 09/06/17 2022    Franchot Heidelberg, PA-C 09/06/17 2256    Valarie Merino, MD 09/06/17 2321

## 2017-09-06 NOTE — ED Notes (Signed)
ED TO INPATIENT HANDOFF REPORT  Name/Age/Gender Ryan Peters 77 y.o. male  Code Status    Code Status Orders  (From admission, onward)        Start     Ordered   09/06/17 2026  Full code  Continuous     09/06/17 2027    Code Status History    This patient has a current code status but no historical code status.      Home/SNF/Other Home  Chief Complaint sepsis  Level of Care/Admitting Diagnosis ED Disposition    ED Disposition Condition Comment   Admit  Hospital Area: [facility] [[id number]]  Level of Care: Stepdown [14]  Admit to SDU based on following criteria: Cardiac Instability:  Patients experiencing chest pain, unconfirmed MI and stable, arrhythmias and CHF requiring medical management and potentially compromising patient's stability  Diagnosis: Fever [161096]  Admitting Physician: Jani Gravel [[id number]]  Attending Physician: Jani Gravel [id number]  Estimated length of stay: past midnight tomorrow  Certification:: I certify this patient will need inpatient services for at least 2 midnights  PT Class (Do Not Modify): Inpatient [101]  PT Acc Code (Do Not Modify): Private [1]       Medical History Past Medical History:  Diagnosis Date  . Arthritis   . Atrial fibrillation ([place])   . Bladder cancer ([place])   . Colon cancer ([place]) 1997  . Diabetes mellitus without complication ([place])   . Dysrhythmia    newly diagnosed with afib 03/2016. Dr Percival Spanish.  Marland Kitchen History of kidney stones   . Hypertension   . Scoliosis     Allergies No Known Allergies  IV Location/Drains/Wounds Patient Lines/Drains/Airways Status   Active Line/Drains/Airways    Name:   Placement date:   Placement time:   Site:   Days:   Peripheral IV 09/06/17 Right Hand   09/06/17    1748    Hand   less than 1   Peripheral IV 09/06/17 Left Forearm   09/06/17    1750    Forearm   less than 1   Urethral Catheter Clint Lipps, MD [place];Latex 22 Fr.   04/09/16    1710    [place];Latex   515   Incision  (Closed) 04/09/16 Penis Other (Comment)   04/09/16    1651     515          Labs/Imaging Results for orders placed or performed during the hospital encounter of 09/06/17 (from the past 48 hour(s))  Comprehensive metabolic panel     Status: Abnormal   Collection Time: 09/06/17  6:34 PM  Result Value Ref Range   Sodium 131 (L) 135 - 145 mmol/L   Potassium 3.6 3.5 - 5.1 mmol/L   Chloride 104 98 - 111 mmol/L    Comment: Please note change in reference range.   CO2 20 (L) 22 - 32 mmol/L   Glucose, Bld 224 (H) 70 - 99 mg/dL    Comment: Please note change in reference range.   BUN 20 8 - 23 mg/dL    Comment: Please note change in reference range.   Creatinine, Ser 1.59 (H) 0.61 - 1.24 mg/dL   Calcium 7.7 (L) 8.9 - 10.3 mg/dL   Total Protein 5.5 (L) 6.5 - 8.1 g/dL   Albumin 2.8 (L) 3.5 - 5.0 g/dL   AST 14 (L) 15 - 41 U/L   ALT 8 0 - 44 U/L    Comment: Please note change in reference range.   Alkaline  Phosphatase 37 (L) 38 - 126 U/L   Total Bilirubin 1.1 0.3 - 1.2 mg/dL   GFR calc non Af Amer 41 (L) >60 mL/min   GFR calc Af Amer 47 (L) >60 mL/min    Comment: (NOTE) The eGFR has been calculated using the CKD EPI equation. This calculation has not been validated in all clinical situations. eGFR's persistently <60 mL/min signify possible Chronic Kidney Disease.    Anion gap 7 5 - 15    Comment: Performed at [facility], [place] [address], [place], [place] [address]  CBC WITH DIFFERENTIAL     Status: Abnormal   Collection Time: 09/06/17  6:34 PM  Result Value Ref Range   WBC 9.9 4.0 - 10.5 K/uL   RBC 3.86 (L) 4.22 - 5.81 MIL/uL   Hemoglobin 11.7 (L) 13.0 - 17.0 g/dL   HCT 34.9 (L) 39.0 - 52.0 %   MCV 90.4 78.0 - 100.0 fL   MCH 30.3 26.0 - 34.0 pg   MCHC 33.5 30.0 - 36.0 g/dL   RDW 14.4 11.5 - 15.5 %   Platelets 185 150 - 400 K/uL   Neutrophils Relative % 89 %   Neutro Abs 8.8 (H) 1.7 - 7.7 K/uL   Lymphocytes Relative 6 %   Lymphs Abs 0.6 (L) 0.7 - 4.0 K/uL    Monocytes Relative 5 %   Monocytes Absolute 0.5 0.1 - 1.0 K/uL   Eosinophils Relative 0 %   Eosinophils Absolute 0.0 0.0 - 0.7 K/uL   Basophils Relative 0 %   Basophils Absolute 0.0 0.0 - 0.1 K/uL    Comment: Performed at [facility], [place] [address], [place], [place] [address]  Urinalysis, Routine w reflex microscopic     Status: Abnormal   Collection Time: 09/06/17  6:34 PM  Result Value Ref Range   Color, Urine YELLOW YELLOW   APPearance CLOUDY (A) CLEAR   Specific Gravity, Urine >1.030 (H) 1.005 - 1.030   pH 6.0 5.0 - 8.0   Glucose, UA 100 (A) NEGATIVE mg/dL   Hgb urine dipstick MODERATE (A) NEGATIVE   Bilirubin Urine SMALL (A) NEGATIVE   Ketones, ur TRACE (A) NEGATIVE mg/dL   Protein, ur 100 (A) NEGATIVE mg/dL   Nitrite POSITIVE (A) NEGATIVE   Leukocytes, UA MODERATE (A) NEGATIVE    Comment: Performed at [facility] [address] [place], [place] [address]  Lactic acid, plasma     Status: None   Collection Time: 09/06/17  6:34 PM  Result Value Ref Range   Lactic Acid, Venous 1.2 0.5 - 1.9 mmol/L    Comment: Performed at [facility], [place] [address], [place], [place] [address]  Urinalysis, Microscopic (reflex)     Status: Abnormal   Collection Time: 09/06/17  6:34 PM  Result Value Ref Range   RBC / HPF 11-20 0 - 5 RBC/hpf   WBC, UA >50 0 - 5 WBC/hpf   Bacteria, UA MANY (A) NONE SEEN   Squamous Epithelial / LPF 0-5 0 - 5   Non Squamous Epithelial PRESENT (A) NONE SEEN   Granular Casts, UA PRESENT     Comment: Performed at [facility], [place] [address], [place], [place] [address]   Dg Chest 2 View  Result Date: 09/06/2017 CLINICAL DATA:  Cough and fever EXAM: CHEST - 2 VIEW COMPARISON:  08/24/2017 FINDINGS: The heart size and mediastinal contours are within normal limits. Both lungs are clear. The visualized skeletal structures  are unremarkable. IMPRESSION: No active cardiopulmonary disease.  Electronically Signed   By: Ulyses Jarred M.D.   On: 09/06/2017 19:09    Pending Labs Unresulted Labs (From admission, onward)   Start     Ordered   09/13/17 0500  Creatinine, serum  (enoxaparin (LOVENOX)    CrCl < 30 ml/min)  Weekly,   R    Comments:  while on enoxaparin therapy.    09/06/17 2027   09/07/17 0500  Comprehensive metabolic panel  Tomorrow morning,   R     09/06/17 2027   09/07/17 0500  CBC  Tomorrow morning,   R     09/06/17 2027   09/06/17 1806  Urine culture  STAT,   STAT     09/06/17 1806   09/06/17 1805  Blood Culture (routine x 2)  BLOOD CULTURE X 2,   STAT     09/06/17 1806      Vitals/Pain Today's Vitals   09/06/17 1901 09/06/17 1945 09/06/17 1946 09/06/17 2000  BP: _0 (!) 91/57  Pulse: (!) 116 (!) 108 100 95  Resp: 19 18 (!) 22 15  Temp:   98.8 F (37.1 C)   TempSrc:   Oral   SpO2: 96% 96% 93% 98%  PainSc:        Isolation Precautions No active isolations  Medications Medications  sodium chloride 0.9 % bolus 1,000 mL (1,000 mLs Intravenous New Bag/Given 09/06/17 2003)  enoxaparin (LOVENOX) injection 30 mg (has no administration in time range)  0.9 %  sodium chloride infusion (has no administration in time range)  acetaminophen (TYLENOL) tablet 650 mg (has no administration in time range)    Or  acetaminophen (TYLENOL) suppository 650 mg (has no administration in time range)  cefTRIAXone (ROCEPHIN) 1 g in sodium chloride 0.9 % 100 mL IVPB (has no administration in time range)  feeding supplement (PRO-STAT SUGAR FREE 64) liquid 30 mL (has no administration in time range)  piperacillin-tazobactam (ZOSYN) IVPB 3.375 g (0 g Intravenous Stopped 09/06/17 1904)  vancomycin (VANCOCIN) IVPB 1000 mg/200 mL premix (0 mg Intravenous Stopped 09/06/17 2004)  sodium chloride 0.9 % bolus 500 mL (0 mLs Intravenous Stopped 09/06/17 1904)    Mobility walks

## 2017-09-06 NOTE — ED Notes (Addendum)
Blood cultures were drawn prior to bolus and antibiotic administration. Cultures were clicked off and meds scanned in sequence, but within the same minute. Times changed by one minute to reflect correct sequence of events.

## 2017-09-06 NOTE — Progress Notes (Signed)
Pharmacy Antibiotic Note  Ryan Peters is a 77 y.o. 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 S/p XRT and carboplatin 06/27/2016.  Pt presented today due to fever. .  Pharmacy has been consulted for vancomycin for sepsis/UTI.  Plan: Vancomycin 1gm IV x 1 in ED then 1000mg  q24h (AUC 468.6, Scr 1.59)>will schedule the 1st dose 18 hours after the ED dose in lieu of bolus Ceftriaxone 1gm IV q24h per MD Follow renal function, cultures and clinical course  Height: 5\' 9"  (175.3 cm) Weight: 191 lb 12.8 oz (87 kg) IBW/kg (Calculated) : 70.7  Temp (24hrs), Avg:99.9 F (37.7 C), Min:98.1 F (36.7 C), Max:102.8 F (39.3 C)  Recent Labs  Lab 09/06/17 1834  WBC 9.9  CREATININE 1.59*  LATICACIDVEN 1.2    Estimated Creatinine Clearance: 43.8 mL/min (A) (by C-G formula based on SCr of 1.59 mg/dL (H)).    No Known Allergies  Antimicrobials this admission: 7/21 vanc >> 7/21 CTX >> 7/21 zosyn x1  Dose adjustments this admission:   Microbiology results: 7/21 BCx:  7/21 UCx:  7/21 MRSA PCR:   Thank you for allowing pharmacy to be a part of this patient's care.  Dolly Rias RPh 09/06/2017, 10:12 PM Pager [phone]

## 2017-09-06 NOTE — H&P (Addendum)
TRH H&P   Patient Demographics:    Ryan Peters, is a 77 y.o. male  MRN: [id number]   DOB - 1940/03/12  Admit Date - 09/06/2017  Outpatient Primary MD for the patient is Morayati, Lourdes Sledge, MD  Referring MD/NP/PA:  Franchot Heidelberg  Outpatient Specialists:  [facility]  Patient coming from: home  Chief Complaint  Patient presents with  . Weakness      HPI:    Ryan Peters  is a 77 y.o. 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  Pt presented today due to fever.  He has had slight dysuria, since cystoscopy 2 weeks ago.  Pt denies flank pain, n/v, diarrhea, brbpr, hematuria.    In ED,   CXR IMPRESSION: No active cardiopulmonary disease.  Na 131, K 3.6, Bun 20, Creatinine 1.59 Ast 14, Alt 8 Alk phos 37, T. Bili 1.1 Glucose 224 Alb 2.8  Wbc 9.9, Hgb 11.7, Plt 185  Urinalysis nitrite +, LE + Wbc >50, rbc 11-20 Lactic acid 1.2 Urine culture pending, blood culture pending   Ekg Afib at 121,  Current heart rate 90  Pt will be admitted for fever secondary to UTI, ? Early sepsis        Review of systems:    In addition to the HPI above,  No Fever-chills, No Headache, No changes with Vision or hearing, No problems swallowing food or Liquids, No Chest pain, Cough or Shortness of Breath, No Abdominal pain, No Nausea or Vommitting, Bowel movements are regular, No Blood in stool or Urine, No dysuria, No new skin rashes or bruises, No new joints pains-aches,  No new weakness, tingling, numbness in any extremity, No recent weight gain or loss, No polyuria, polydypsia or polyphagia, No significant Mental Stressors.  A full 10 point Review of Systems was done, except as stated above, all other Review of Systems were  negative.   With Past History of the following :    Past Medical History:  Diagnosis Date  . Arthritis   . Bladder cancer ([place])   . Colon cancer ([place]) 1997  . Diabetes mellitus without complication ([place])   . Dysrhythmia    newly diagnosed with afib 03/2016. Dr Percival Spanish.  Marland Kitchen History of kidney stones   . Hypertension   . Scoliosis       Past Surgical History:  Procedure Laterality Date  . APPENDECTOMY    . CHOLECYSTECTOMY    . COLECTOMY  1997  . CYSTOSCOPY W/ URETERAL STENT PLACEMENT N/A 04/09/2016   Procedure: CYSTOSCOPY WITH RETROGRADE PYELOGRAM/;  Surgeon: Alexis Frock, MD;  Location: [facility];  Service: Urology;  Laterality: N/A;  . TONSILLECTOMY    . TRANSURETHRAL RESECTION OF BLADDER TUMOR N/A 04/09/2016   Procedure: TRANSURETHRAL RESECTION OF BLADDER TUMOR (TURBT);  Surgeon: Alexis Frock,  MD;  Location: [facility];  Service: Urology;  Laterality: N/A;      Social History:     Social History   Tobacco Use  . Smoking status: Current Some Day Smoker    Packs/day: 0.25    Years: 50.00    Pack years: 12.50    Types: Cigarettes  . Smokeless tobacco: Never Used  . Tobacco comment: Trying to quit smoking.   Substance Use Topics  . Alcohol use: No     Lives - at home  Mobility - walks by self   Family History :     Family History  Problem Relation Age of Onset  . Diabetes Mother   . Heart attack Father 34  . Kidney cancer Brother 28  . Hodgkin's lymphoma Maternal Uncle   . Throat cancer Maternal Uncle        Home Medications:   Prior to Admission medications   Medication Sig Start Date End Date Taking? Authorizing Provider  aspirin EC 81 MG tablet Take 81 mg by mouth daily.   Yes [provider]  atenolol (TENORMIN) 25 MG tablet Take 25 mg by mouth 2 (two) times daily.    Yes [provider]  clopidogrel (PLAVIX) 75 MG tablet Take 75 mg by mouth daily.   Yes [provider]  lisinopril (PRINIVIL,ZESTRIL) 5 MG tablet Take 5 mg  by mouth daily.   Yes [provider]  naproxen sodium (ANAPROX) 220 MG tablet Take 440-660 mg by mouth daily as needed (for pain (seldom)).   Yes [provider]  sitaGLIPtin-metformin (JANUMET) 50-1000 MG tablet Take 1 tablet by mouth daily.    Yes [provider]  prochlorperazine (COMPAZINE) 10 MG tablet Take 1 tablet (10 mg total) by mouth every 6 (six) hours as needed for nausea or vomiting. Patient not taking: Reported on 09/06/2017 07/24/16   Wyatt Portela, MD     Allergies:    No Known Allergies   Physical Exam:   Vitals  Blood pressure 99/63, pulse 100, temperature 98.8 F (37.1 C), temperature source Oral, resp. rate (!) 22, SpO2 93 %.   1. General  lying in bed in NAD,   2. Normal affect and insight, Not Suicidal or Homicidal, Awake Alert, Oriented X 3.  3. No F.N deficits, ALL C.Nerves Intact, Strength 5/5 all 4 extremities, Sensation intact all 4 extremities, Plantars down going.  4. Ears and Eyes appear Normal, Conjunctivae clear, PERRLA. Moist Oral Mucosa.  5. Supple Neck, No JVD, No cervical lymphadenopathy appriciated, No Carotid Bruits.  6. Symmetrical Chest wall movement, Good air movement bilaterally, CTAB.  7. Irr, irr, s1, s2,  8. Positive Bowel Sounds, Abdomen Soft, No tenderness, No organomegaly appriciated,No rebound -guarding or rigidity.  9.  No Cyanosis, Normal Skin Turgor, No Skin Rash or Bruise.  10. Good muscle tone,  joints appear normal , no effusions, Normal ROM.  11. No Palpable Lymph Nodes in Neck or Axillae   no cva tenderness   Data Review:    CBC Recent Labs  Lab 09/06/17 1834  WBC 9.9  HGB 11.7*  HCT 34.9*  PLT 185  MCV 90.4  MCH 30.3  MCHC 33.5  RDW 14.4  LYMPHSABS 0.6*  MONOABS 0.5  EOSABS 0.0  BASOSABS 0.0   ------------------------------------------------------------------------------------------------------------------  Chemistries  Recent Labs  Lab 09/06/17 1834  NA 131*  K 3.6   CL 104  CO2 20*  GLUCOSE 224*  BUN 20  CREATININE 1.59*  CALCIUM 7.7*  AST 14*  ALT 8  ALKPHOS 37*  BILITOT 1.1   ------------------------------------------------------------------------------------------------------------------ CrCl cannot be calculated (Unknown ideal weight.). ------------------------------------------------------------------------------------------------------------------ No results for input(s): TSH, T4TOTAL, T3FREE, THYROIDAB in the last 72 hours.  Invalid input(s): FREET3  Coagulation profile No results for input(s): INR, PROTIME in the last 168 hours. ------------------------------------------------------------------------------------------------------------------- No results for input(s): DDIMER in the last 72 hours. -------------------------------------------------------------------------------------------------------------------  Cardiac Enzymes No results for input(s): CKMB, TROPONINI, MYOGLOBIN in the last 168 hours.  Invalid input(s): CK ------------------------------------------------------------------------------------------------------------------ No results found for: BNP   ---------------------------------------------------------------------------------------------------------------  Urinalysis    Component Value Date/Time   COLORURINE YELLOW 09/06/2017 1834   APPEARANCEUR CLOUDY (A) 09/06/2017 1834   LABSPEC >1.030 (H) 09/06/2017 1834   PHURINE 6.0 09/06/2017 1834   GLUCOSEU 100 (A) 09/06/2017 1834   HGBUR MODERATE (A) 09/06/2017 1834   BILIRUBINUR SMALL (A) 09/06/2017 1834   KETONESUR TRACE (A) 09/06/2017 1834   PROTEINUR 100 (A) 09/06/2017 1834   NITRITE POSITIVE (A) 09/06/2017 1834   LEUKOCYTESUR MODERATE (A) 09/06/2017 1834    ----------------------------------------------------------------------------------------------------------------   Imaging Results:    Dg Chest 2 View  Result Date: 09/06/2017 CLINICAL DATA:   Cough and fever EXAM: CHEST - 2 VIEW COMPARISON:  08/24/2017 FINDINGS: The heart size and mediastinal contours are within normal limits. Both lungs are clear. The visualized skeletal structures are unremarkable. IMPRESSION: No active cardiopulmonary disease. Electronically Signed   By: Ulyses Jarred M.D.   On: 09/06/2017 19:09       Assessment & Plan:    Principal Problem:   Fever Active Problems:   Acute lower UTI    Fever ? Early sepsis (fever, tachycardia, hypotension) Blood culture x2 Urine culture Vanco iv pharmacy to dose Rocephin 1gm iv qday  UTI Urine culture as above Start Abx as above   Pafib w initially afib with RVR Check trop I q6h x3 Check cardiac echo Cont aspirin, plavix, atenolol Cardiology consult requested for AM , email sent  Hypotension Tele Trop I q6h x3 Hydrate with ns iv  Renal insufficiency STOP Lisinopril Hydrate with ns iv Check cmp in am  Dm2 STOP Janumet due to renal insufficiency fsbs ac and qhs, ISS     DVT Prophylaxis Lovenox - SCDs   AM Labs Ordered, also please review Full Orders  Family Communication: Admission, patients condition and plan of care including tests being ordered have been discussed with the patient  who indicate understanding and agree with the plan and Code Status.  Code Status FULL CODE  Likely DC to  home  Condition GUARDED    Consults called:  Cardiology consult by email to help manage Afib  Admission status: inpatient  Time spent in minutes : 70   Jani Gravel M.D on 09/06/2017 at 8:24 PM  Between 7am to 7pm - Pager - [phone] . After 7pm go to [email or web address] - password [email or web address]  [facility] - Office  [phone]

## 2017-09-07 DIAGNOSIS — A419 Sepsis, unspecified organism: Secondary | ICD-10-CM

## 2017-09-07 DIAGNOSIS — I4891 Unspecified atrial fibrillation: Secondary | ICD-10-CM | POA: Diagnosis present

## 2017-09-07 DIAGNOSIS — I420 Dilated cardiomyopathy: Secondary | ICD-10-CM

## 2017-09-07 DIAGNOSIS — R5081 Fever presenting with conditions classified elsewhere: Secondary | ICD-10-CM

## 2017-09-07 LAB — COMPREHENSIVE METABOLIC PANEL
ALBUMIN: 3.1 g/dL — AB (ref 3.5–5.0)
ALT: 10 U/L (ref 0–44)
AST: 17 U/L (ref 15–41)
Alkaline Phosphatase: 42 U/L (ref 38–126)
Anion gap: 7 (ref 5–15)
BILIRUBIN TOTAL: 1.1 mg/dL (ref 0.3–1.2)
BUN: 19 mg/dL (ref 8–23)
CO2: 24 mmol/L (ref 22–32)
Calcium: 8.3 mg/dL — ABNORMAL LOW (ref 8.9–10.3)
Chloride: 108 mmol/L (ref 98–111)
Creatinine, Ser: 1.47 mg/dL — ABNORMAL HIGH (ref 0.61–1.24)
GFR calc Af Amer: 52 mL/min — ABNORMAL LOW (ref >60)
GFR calc non Af Amer: 45 mL/min — ABNORMAL LOW (ref >60)
Glucose, Bld: 113 mg/dL — ABNORMAL HIGH (ref 70–99)
POTASSIUM: 4.1 mmol/L (ref 3.5–5.1)
Sodium: 139 mmol/L (ref 135–145)
Total Protein: 6.4 g/dL — ABNORMAL LOW (ref 6.5–8.1)

## 2017-09-07 LAB — GLUCOSE, CAPILLARY
Glucose-Capillary: 81 mg/dL (ref 70–99)
Glucose-Capillary: 104 mg/dL — ABNORMAL HIGH (ref 70–99)
Glucose-Capillary: 98 mg/dL (ref 70–99)
Glucose-Capillary: 80 mg/dL (ref 70–99)

## 2017-09-07 LAB — MRSA PCR SCREENING: MRSA by PCR: POSITIVE — AB

## 2017-09-07 LAB — CBC
HCT: 37.3 % — ABNORMAL LOW (ref 39.0–52.0)
Hemoglobin: 12 g/dL — ABNORMAL LOW (ref 13.0–17.0)
MCH: 29.3 pg (ref 26.0–34.0)
MCHC: 32.2 g/dL (ref 30.0–36.0)
MCV: 91 fL (ref 78.0–100.0)
Platelets: 203 K/uL (ref 150–400)
RBC: 4.1 MIL/uL — ABNORMAL LOW (ref 4.22–5.81)
RDW: 14.5 % (ref 11.5–15.5)
WBC: 11.8 K/uL — ABNORMAL HIGH (ref 4.0–10.5)

## 2017-09-07 MED ORDER — MUPIROCIN 2 % EX OINT
1.0000 "application " | TOPICAL_OINTMENT | Freq: Two times a day (BID) | CUTANEOUS | Status: DC
  Administered 2017-09-07 – 2017-09-09 (×6): 1 via NASAL
  Filled 2017-09-07 (×2): qty 22

## 2017-09-07 MED ORDER — ZOLPIDEM TARTRATE 5 MG PO TABS
5.0000 mg | ORAL_TABLET | Freq: Every day | ORAL | Status: DC
  Administered 2017-09-07 – 2017-09-08 (×2): 5 mg via ORAL
  Filled 2017-09-07 (×2): qty 1

## 2017-09-07 MED ORDER — ATENOLOL 25 MG PO TABS: 25.0000 mg | ORAL_TABLET | Freq: Two times a day (BID) | ORAL | Status: DC

## 2017-09-07 MED ORDER — METOPROLOL TARTRATE 25 MG PO TABS: 25.0000 mg | ORAL_TABLET | Freq: Four times a day (QID) | ORAL | Status: DC

## 2017-09-07 MED ORDER — ENOXAPARIN SODIUM 40 MG/0.4ML ~~LOC~~ SOLN
40.0000 mg | SUBCUTANEOUS | Status: DC
  Administered 2017-09-07: 40 mg via SUBCUTANEOUS
  Filled 2017-09-07: qty 0.4

## 2017-09-07 MED ORDER — ATENOLOL 25 MG PO TABS
25.0000 mg | ORAL_TABLET | Freq: Two times a day (BID) | ORAL | Status: DC
  Administered 2017-09-07 – 2017-09-08 (×2): 25 mg via ORAL
  Filled 2017-09-07 (×2): qty 1

## 2017-09-07 MED ORDER — CHLORHEXIDINE GLUCONATE CLOTH 2 % EX PADS
6.0000 | MEDICATED_PAD | Freq: Every day | CUTANEOUS | Status: DC
  Administered 2017-09-07 – 2017-09-09 (×3): 6 via TOPICAL

## 2017-09-07 NOTE — Progress Notes (Signed)
[facility] Progress Note  Patient: Ryan Peters DDU:[id number]   PCP: Lenard Simmer, MD DOB: Aug 24, 1940   DOA: 09/06/2017   DOS: 09/07/2017   Date of Service: the patient was seen and examined on 09/07/2017  Subjective: Feeling better, no nausea no vomiting no fever no chills.  No chest pain no abdominal pain.  No diarrhea.  No constipation.  No blood in the urine.  Peeing okay.  Wants to go home.  Brief hospital course: Pt. with PMH of HTN, type II DM, A. fib, colon cancer, urothelial cancer S/P resection, S/P radiation and chemotherapy, recent cystoscopy; admitted on 09/06/2017, presented with complaint of fever and weakness, was found to have sepsis due to UTI. Currently further plan is continue IV antibiotics.  Assessment and Plan: 1.  Sepsis due to UTI. Recent cystoscopy with bladder extension. Inform urology. Urine cultures blood cultures currently pending. Still febrile T-max 102. Leukocytosis getting better. Blood pressure getting better heart rate getting better. Continue to monitor on telemetry but can be transition out of the stepdown unit. Will need to monitor the blood cultures uppercase 48 hours. On IV vancomycin and IV ceftriaxone. for a patient who just recently had a urological procedure may need pseudomonal coverage but the patient is currently responding well to this antibiotic combination and therefore will continue.  2.  Paroxysmal A. fib with RVR. Currently normal sinus rhythm. Rate is well controlled. Echocardiogram pending. Cardiology consulted. Patient is on aspirin and Plavix at home Was on Xarelto in the past and developed hematuria, cardiology recommended to consider therapeutic anticoagulation with Eliquis for the patient  will discuss with neurology. Continue atenolol 25 mg for rate control for now.  3.  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  4.  Type 2 diabetes mellitus. With chronic  kidney disease stage III nephropathy. Continue sliding scale insulin. Holding Januvia. Holding metformin.  Adding lisinopril for now.  5.  Insomnia and anxiety. Ambien at night  Diet: carb modified DVT Prophylaxis: subcutaneous Heparin  Advance goals of care discussion: full code  Family Communication: no family was present at bedside, at the time of interview.  Disposition:  Discharge to home in 2-3 days.  Consultants: cardiology,  Procedures: Echocardiogram   Antibiotics: Anti-infectives (From admission, onward)   Start     Dose/Rate Route Frequency Ordered Stop   09/07/17 1200  vancomycin (VANCOCIN) IVPB 1000 mg/200 mL premix     1,000 mg 200 mL/hr over 60 Minutes Intravenous Every 24 hours 09/06/17 2219     09/06/17 2359  cefTRIAXone (ROCEPHIN) 1 g in sodium chloride 0.9 % 100 mL IVPB     1 g 200 mL/hr over 30 Minutes Intravenous Every 24 hours 09/06/17 2027     09/06/17 1815  piperacillin-tazobactam (ZOSYN) IVPB 3.375 g     3.375 g 100 mL/hr over 30 Minutes Intravenous  Once 09/06/17 1806 09/06/17 1904   09/06/17 1815  vancomycin (VANCOCIN) IVPB 1000 mg/200 mL premix     1,000 mg 200 mL/hr over 60 Minutes Intravenous  Once 09/06/17 1806 09/06/17 2004       Objective: Physical Exam: Vitals:   09/07/17 0245 09/07/17 0429 09/07/17 0437 09/07/17 0800  BP: (!) 144/97     Pulse:      Resp:      Temp:   (!) 102.9 F (39.4 C) 99.2 F (37.3 C)  TempSrc:   Oral Oral  SpO2:      Weight:  87 kg (191 lb  12.8 oz)    Height:        Intake/Output Summary (Last 24 hours) at 09/07/2017 1456 Last data filed at 09/07/2017 1400 Gross per 24 hour  Intake 3267.9 ml  Output 800 ml  Net 2467.9 ml   Filed Weights   09/06/17 2123 09/07/17 0429  Weight: 87 kg (191 lb 12.8 oz) 87 kg (191 lb 12.8 oz)   General: Alert, Awake and Oriented to Time, Place and Person. Appear in moderate distress, affect irritable Eyes: PERRL, Conjunctiva normal ENT: Oral Mucosa clear moist. Neck:  no JVD, no Abnormal Mass Or lumps Cardiovascular: S1 and S2 Present, no Murmur, Peripheral Pulses Present Respiratory: normal respiratory effort, Bilateral Air entry equal and Decreased, no use of accessory muscle, Clear to Auscultation, no Crackles, no wheezes Abdomen: Bowel Sound present, Soft and no tenderness, no hernia Skin: no redness, no Rash, no induration Extremities: no Pedal edema, no calf tenderness Neurologic: Grossly no focal neuro deficit. Bilaterally Equal motor strength  Data Reviewed: CBC: Recent Labs  Lab 09/06/17 1834 09/07/17 0334  WBC 9.9 11.8*  NEUTROABS 8.8*  --   HGB 11.7* 12.0*  HCT 34.9* 37.3*  MCV 90.4 91.0  PLT 185 654   Basic Metabolic Panel: Recent Labs  Lab 09/06/17 1834 09/07/17 0334  NA 131* 139  K 3.6 4.1  CL 104 108  CO2 20* 24  GLUCOSE 224* 113*  BUN 20 19  CREATININE 1.59* 1.47*  CALCIUM 7.7* 8.3*    Liver Function Tests: Recent Labs  Lab 09/06/17 1834 09/07/17 0334  AST 14* 17  ALT 8 10  ALKPHOS 37* 42  BILITOT 1.1 1.1  PROT 5.5* 6.4*  ALBUMIN 2.8* 3.1*   No results for input(s): LIPASE, AMYLASE in the last 168 hours. No results for input(s): AMMONIA in the last 168 hours. Coagulation Profile: No results for input(s): INR, PROTIME in the last 168 hours. Cardiac Enzymes: No results for input(s): CKTOTAL, CKMB, CKMBINDEX, TROPONINI in the last 168 hours. BNP (last 3 results) No results for input(s): PROBNP in the last 8760 hours. CBG: Recent Labs  Lab 09/06/17 2135 09/07/17 0751 09/07/17 1114  GLUCAP 136* 81 104*   Studies: Dg Chest 2 View  Result Date: 09/06/2017 CLINICAL DATA:  Cough and fever EXAM: CHEST - 2 VIEW COMPARISON:  08/24/2017 FINDINGS: The heart size and mediastinal contours are within normal limits. Both lungs are clear. The visualized skeletal structures are unremarkable. IMPRESSION: No active cardiopulmonary disease. Electronically Signed   By: Ulyses Jarred M.D.   On: 09/06/2017 19:09      Scheduled Meds: . aspirin EC  81 mg Oral Daily  . atenolol  25 mg Oral BID  . Chlorhexidine Gluconate Cloth  6 each Topical Daily  . clopidogrel  75 mg Oral Q breakfast  . enoxaparin (LOVENOX) injection  40 mg Subcutaneous Q24H  . feeding supplement (PRO-STAT SUGAR FREE 64)  30 mL Oral BID  . insulin aspart  0-5 Units Subcutaneous QHS  . insulin aspart  0-9 Units Subcutaneous TID WC  . mupirocin ointment  1 application Nasal BID  . zolpidem  5 mg Oral QHS   Continuous Infusions: . sodium chloride 100 mL/hr at 09/07/17 1400  . cefTRIAXone (ROCEPHIN)  IV Stopped (09/07/17 0130)  . vancomycin Stopped (09/07/17 1224)   PRN Meds: acetaminophen **OR** acetaminophen  Time spent: 35 minutes  Author: Berle Mull, MD [facility] Pager: [phone] 09/07/2017 2:56 PM  If 7PM-7AM, please contact night-coverage at [email or web address], password [facility]

## 2017-09-07 NOTE — Progress Notes (Signed)
Patient arrived from ICU with no distress noted. Vital signs stable and no distress noted. Sitting in chair, call bell within reach. Will continue to monitor.

## 2017-09-07 NOTE — Consult Note (Signed)
Cardiology Consultation:   Patient ID: Ryan Peters; [id number]; 1940-12-15   Admit date: 09/06/2017 Date of Consult: 09/07/2017  Primary Care Provider: Lenard Simmer, MD Primary Cardiologist: Minus Breeding, MD  Primary Electrophysiologist:  N/A   Patient Profile:   Ryan Peters is a 77 y.o. male with a hx of atrial fibrillation, chronic RBBB, T2DM, HTN, mild systolic HF,  Remote h/o colon cancer and recent bladder cancer s/p resection, radiation and chemo in 2018,  who is being seen today for the evaluation of atrial fibrillation w/ RVR, in the setting of UTI, at the request of Dr. Posey Pronto, Internal Medicine.  History of Present Illness:   Ryan Peters is a 77 y.o. male with a hx of atrial fibrillation, T2DM, HTN, mild systolic HF, remote h/o colon cancer and recent bladder cancer s/p resection, radiation and chemo in 2018,  who is being seen today for the evaluation of atrial fibrillation w/ RVR, in the setting of UTI, at the request of Dr. Posey Pronto, Internal Medicine.  Pt was referred to Dr. Percival Spanish for outpatient evaluation for atrial fibrillation, that was discovered prior to bladder surgery in Feb 2018. Pt was asymptomatic and afib onset was unknown. Echo was obtained by Dr. Percival Spanish 04/2016 which showed mildly reduced LVEF at 45-50% w/ diffuse hypokinesis. LA was mildly dilated. No significant valvular disease. Given CHA2DS2 VASc score > 2, Dr. Percival Spanish recommended Xarelto for a/c, but to start after bladder surgery. Additional recs were for outpatient Holter monitor to ensure that he maintained adequate rate control, however this was never completed. Pt was supposed to f/u with Dr. Percival Spanish following bladder surgery, but I do not see that this ever took place.   Pt had bladder surgery on 04/09/16 for resection. 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 He declined radical cystectomy and he elected to proceeded  w/ definitive radiation therapy with weekly carboplatin. His first infusion of carboplatin was given on 06/27/2016. Therapy completed on 08/01/2016. He is still followed by urology. Pt noted that he did start Xarelto following bladder surgery but had significant hematuria, and discontinued use. His wife notes that his PCP subsequently placed him on ASA and Plavix and he has tolerated this ok w/o recurrent hematuria.   Pt presented to the [facility] ED last night with complaint of fever and dysuria. UA + with > 50 WBC and nitrate +. Urine culture pending. Pt reportedly had a cystoscopy 2 weeks ago for survalience. Pt also noted to be in afib w/ RVR in the 120s. Pt was admitted by IM for UTI and rapid afib. Cardiology consulted for afib management.   Pt is on Vanc + Rocephin for UTI.   He is currently on atenolol and HR is 90s-low 100s. He is asymptomatic. Labs are notable for WBC at 11.8. Hgb at 12.0. K WNL at 4.1. SCr 1.47(down from 1.59 on admit). Baseline SCr over the past year ~1.1-1.5.   Past Medical History:  Diagnosis Date  . Arthritis   . Atrial fibrillation ([place])   . Bladder cancer ([place])   . Colon cancer ([place]) 1997  . Diabetes mellitus without complication ([place])   . Dysrhythmia    newly diagnosed with afib 03/2016. Dr Percival Spanish.  Marland Kitchen History of kidney stones   . Hypertension   . Scoliosis     Past Surgical History:  Procedure Laterality Date  . APPENDECTOMY    . CHOLECYSTECTOMY    . COLECTOMY  1997  . CYSTOSCOPY W/  URETERAL STENT PLACEMENT N/A 04/09/2016   Procedure: CYSTOSCOPY WITH RETROGRADE PYELOGRAM/;  Surgeon: Alexis Frock, MD;  Location: [facility];  Service: Urology;  Laterality: N/A;  . TONSILLECTOMY    . TRANSURETHRAL RESECTION OF BLADDER TUMOR N/A 04/09/2016   Procedure: TRANSURETHRAL RESECTION OF BLADDER TUMOR (TURBT);  Surgeon: Alexis Frock, MD;  Location: [facility];  Service: Urology;  Laterality: N/A;     Home Medications:  Prior to Admission medications   Medication Sig  Start Date End Date Taking? Authorizing Provider  aspirin EC 81 MG tablet Take 81 mg by mouth daily.   Yes [provider]  atenolol (TENORMIN) 25 MG tablet Take 25 mg by mouth 2 (two) times daily.    Yes [provider]  clopidogrel (PLAVIX) 75 MG tablet Take 75 mg by mouth daily.   Yes [provider]  lisinopril (PRINIVIL,ZESTRIL) 5 MG tablet Take 5 mg by mouth daily.   Yes [provider]  naproxen sodium (ANAPROX) 220 MG tablet Take 440-660 mg by mouth daily as needed (for pain (seldom)).   Yes [provider]  sitaGLIPtin-metformin (JANUMET) 50-1000 MG tablet Take 1 tablet by mouth daily.    Yes [provider]  prochlorperazine (COMPAZINE) 10 MG tablet Take 1 tablet (10 mg total) by mouth every 6 (six) hours as needed for nausea or vomiting. Patient not taking: Reported on 09/06/2017 07/24/16   Wyatt Portela, MD    Inpatient Medications: Scheduled Meds: . aspirin EC  81 mg Oral Daily  . atenolol  25 mg Oral BID  . Chlorhexidine Gluconate Cloth  6 each Topical Daily  . clopidogrel  75 mg Oral Q breakfast  . enoxaparin (LOVENOX) injection  40 mg Subcutaneous Q24H  . feeding supplement (PRO-STAT SUGAR FREE 64)  30 mL Oral BID  . insulin aspart  0-5 Units Subcutaneous QHS  . insulin aspart  0-9 Units Subcutaneous TID WC  . mupirocin ointment  1 application Nasal BID  . zolpidem  5 mg Oral QHS   Continuous Infusions: . sodium chloride 100 mL/hr at 09/07/17 0842  . cefTRIAXone (ROCEPHIN)  IV Stopped (09/07/17 0130)  . vancomycin     PRN Meds: acetaminophen **OR** acetaminophen  Allergies:   No Known Allergies  Social History:   Social History   Socioeconomic History  . Marital status: Married    Spouse name: Not on file  . Number of children: Not on file  . Years of education: Not on file  . Highest education level: Not on file  Occupational History  . Not on file  Social Needs  . Financial resource strain: Not on  file  . Food insecurity:    Worry: Not on file    Inability: Not on file  . Transportation needs:    Medical: Not on file    Non-medical: Not on file  Tobacco Use  . Smoking status: Current Some Day Smoker    Packs/day: 0.25    Years: 50.00    Pack years: 12.50    Types: Cigarettes  . Smokeless tobacco: Never Used  . Tobacco comment: Trying to quit smoking.   Substance and Sexual Activity  . Alcohol use: No  . Drug use: No  . Sexual activity: Not Currently  Lifestyle  . Physical activity:    Days per week: Not on file    Minutes per session: Not on file  . Stress: Not on file  Relationships  . Social connections:    Talks on phone: Not  on file    Gets together: Not on file    Attends religious service: Not on file    Active member of club or organization: Not on file    Attends meetings of clubs or organizations: Not on file    Relationship status: Not on file  . Intimate partner violence:    Fear of current or ex partner: Not on file    Emotionally abused: Not on file    Physically abused: Not on file    Forced sexual activity: Not on file  Other Topics Concern  . Not on file  Social History Narrative   epworth sleepiness scale score: 5     Family History:    Family History  Problem Relation Age of Onset  . Diabetes Mother   . Heart attack Father 57  . Kidney cancer Brother 72  . Hodgkin's lymphoma Maternal Uncle   . Throat cancer Maternal Uncle      ROS:  Please see the history of present illness.   All other ROS reviewed and negative.     Physical Exam/Data:   Vitals:   09/07/17 0245 09/07/17 0429 09/07/17 0437 09/07/17 0800  BP: (!) 144/97     Pulse:      Resp:      Temp:   (!) 102.9 F (39.4 C) 99.2 F (37.3 C)  TempSrc:   Oral Oral  SpO2:      Weight:  191 lb 12.8 oz (87 kg)    Height:        Intake/Output Summary (Last 24 hours) at 09/07/2017 0929 Last data filed at 09/07/2017 0800 Gross per 24 hour  Intake 2565.4 ml  Output 600 ml    Net 1965.4 ml   Filed Weights   09/06/17 2123 09/07/17 0429  Weight: 191 lb 12.8 oz (87 kg) 191 lb 12.8 oz (87 kg)   Body mass index is 28.32 kg/m.  General:  Well nourished, well developed WM, in no acute distress HEENT: normal Lymph: no adenopathy Neck: no JVD Endocrine:  No thryomegaly Vascular: No carotid bruits; FA pulses 2+ bilaterally without bruits  Cardiac:  irregularly irregular rhythm, mildly tachy rate Lungs:  clear to auscultation bilaterally, no wheezing, rhonchi or rales  Abd: soft, nontender, no hepatomegaly  Ext: no edema Musculoskeletal:  No deformities, BUE and BLE strength normal and equal Skin: warm and dry  Neuro:  CNs 2-12 intact, no focal abnormalities noted Psych:  Normal affect   EKG:  The EKG was personally reviewed and demonstrates:  09/06/17 Atrial fibrillation, RBBB and LAFB, 121 bpm  Telemetry:  Telemetry was personally reviewed and demonstrates:  Atrial fibrillation in the 90s-low 100s resting   Relevant CV Studies: 2D Echo 04/23/16  Study Conclusions  - Left ventricle: The cavity size was normal. There was mild   concentric hypertrophy. Systolic function was mildly reduced. The   estimated ejection fraction was in the range of 45% to 50%.   Diffuse hypokinesis. - Aortic valve: There was no regurgitation. - Aortic root: The aortic root was normal in size. - Mitral valve: There was trivial regurgitation. - Left atrium: The atrium was mildly dilated. - Right ventricle: The cavity size was mildly dilated. Wall   thickness was normal. Systolic function was mildly reduced. - Right atrium: The atrium was mildly dilated. - Tricuspid valve: There was mild regurgitation. - Pulmonary arteries: Systolic pressure was within the normal   range. - Inferior vena cava: The vessel was normal in size. - Pericardium,  extracardiac: There was no pericardial effusion.  Impressions:  - Mild left and right systolic dysfunction.   Atrial  fibrillation.    Laboratory Data:  Chemistry Recent Labs  Lab 09/06/17 1834 09/07/17 0334  NA 131* 139  K 3.6 4.1  CL 104 108  CO2 20* 24  GLUCOSE 224* 113*  BUN 20 19  CREATININE 1.59* 1.47*  CALCIUM 7.7* 8.3*  GFRNONAA 41* 45*  GFRAA 47* 52*  ANIONGAP 7 7    Recent Labs  Lab 09/06/17 1834 09/07/17 0334  PROT 5.5* 6.4*  ALBUMIN 2.8* 3.1*  AST 14* 17  ALT 8 10  ALKPHOS 37* 42  BILITOT 1.1 1.1   Hematology Recent Labs  Lab 09/06/17 1834 09/07/17 0334  WBC 9.9 11.8*  RBC 3.86* 4.10*  HGB 11.7* 12.0*  HCT 34.9* 37.3*  MCV 90.4 91.0  MCH 30.3 29.3  MCHC 33.5 32.2  RDW 14.4 14.5  PLT 185 203   Cardiac EnzymesNo results for input(s): TROPONINI in the last 168 hours. No results for input(s): TROPIPOC in the last 168 hours.  BNPNo results for input(s): BNP, PROBNP in the last 168 hours.  DDimer No results for input(s): DDIMER in the last 168 hours.  Radiology/Studies:  Dg Chest 2 View  Result Date: 09/06/2017 CLINICAL DATA:  Cough and fever EXAM: CHEST - 2 VIEW COMPARISON:  08/24/2017 FINDINGS: The heart size and mediastinal contours are within normal limits. Both lungs are clear. The visualized skeletal structures are unremarkable. IMPRESSION: No active cardiopulmonary disease. Electronically Signed   By: Ulyses Jarred M.D.   On: 09/06/2017 19:09    Assessment and Plan:   Ryan Peters is a 77 y.o. male with a hx of atrial fibrillation, T2DM, HTN, mild systolic HF, remote h/o colon cancer and recent bladder cancer s/p resection, radiation and chemo in 2018,  who is being seen today for the evaluation of atrial fibrillation w/ RVR, in the setting of UTI, at the request of Dr. Posey Pronto, Internal Medicine.  1. Atrial Fibrillation w/ RVR: known h/o afib, first discovered in 2018 prior to undergoing bladder surgery. Seen by Dr. Percival Spanish in clinic 03/2016 for evaluation. Pt asymptomatic at that time. Echo showed mildy reduced EF at 45-50% with diffuse hypokinesis (no  prior studies for comparison). Plan was for 48 hr Holter monitor after bladder surgery to ensure that rates were adequately controlled, + addition of Xarelto for stroke prophylaxis, given CHA2DS2 VASc score > 2 (HTN, DM and HF). However, pt failed to f/u after surgery.  He did not tolerate Xarelto due to significant hematuria. His PCP subsequently placed him on ASA and Plavix. He has tolerated this ok w/o recurrent hematuria. Now admitted for UTI and rapid afib. Suspect rapid ventricular response is likely driven by underlying infection. K is WNL. His rates have improved significantly overnight from the 120s to now 90s-low 100s after initiation of antibiotics. Recommend continued treatment of UTI w/ antibiotics. He is currently on BB therapy with Atenolol. We can further titrate as needed. Would avoid addition of CCB given mildly reduced EF. ASA and Plavix is not typically routinely used for primary prevention of stroke/TIA for atrial fibrillation. ? Trial of Eliquis. We may need urology to weigh in regarding bleed risk. Will discuss with MD.  2. UTI: UA with WBC >50 and nitrate +. Urine culture pending. Antibiotics per IM. Currently on broad spectrum. IM to narrow down antibiotics based on culture growth.   3. Systolic HF: echo in 0/6237 showed mildly reduced LVEF  at 45-50% with diffuse hypokinesis. No ischemic w/u pursued in 2018. This may be tachy mediated from atrial fibrillation. Overall he appears euvolemic on exam. He denies dyspnea.   4. Renal Insuffiencey: SCr 1.59>>1.47, c/w baseline over the past year. Continue to monitor.   5. H/o Bladder Cancer: treated with resection, radiation and chemo in 2018. Followed by urology.    For questions or updates, please contact [place] Please consult [email or web address] for contact info under Cardiology/STEMI.   Signed, Lyda Jester, PA-C  09/07/2017 9:29 AM

## 2017-09-08 ENCOUNTER — Other Ambulatory Visit (HOSPITAL_COMMUNITY): Payer: Medicare Other

## 2017-09-08 DIAGNOSIS — I5022 Chronic systolic (congestive) heart failure: Secondary | ICD-10-CM

## 2017-09-08 LAB — CBC WITH DIFFERENTIAL/PLATELET
BASOS ABS: 0 10*3/uL (ref 0.0–0.1)
Basophils Relative: 0 %
EOS PCT: 0 %
Eosinophils Absolute: 0 10*3/uL (ref 0.0–0.7)
HEMATOCRIT: 35.1 % — AB (ref 39.0–52.0)
Hemoglobin: 11.4 g/dL — ABNORMAL LOW (ref 13.0–17.0)
LYMPHS PCT: 4 %
Lymphs Abs: 0.3 10*3/uL — ABNORMAL LOW (ref 0.7–4.0)
MCH: 29.4 pg (ref 26.0–34.0)
MCHC: 32.5 g/dL (ref 30.0–36.0)
MCV: 90.5 fL (ref 78.0–100.0)
MONO ABS: 0.7 10*3/uL (ref 0.1–1.0)
Monocytes Relative: 10 %
NEUTROS ABS: 5.7 10*3/uL (ref 1.7–7.7)
Neutrophils Relative %: 86 %
Platelets: 173 10*3/uL (ref 150–400)
RBC: 3.88 MIL/uL — AB (ref 4.22–5.81)
RDW: 14.4 % (ref 11.5–15.5)
WBC: 6.7 10*3/uL (ref 4.0–10.5)

## 2017-09-08 LAB — COMPREHENSIVE METABOLIC PANEL
ALT: 12 U/L (ref 0–44)
AST: 16 U/L (ref 15–41)
Albumin: 2.9 g/dL — ABNORMAL LOW (ref 3.5–5.0)
Alkaline Phosphatase: 39 U/L (ref 38–126)
Anion gap: 8 (ref 5–15)
BILIRUBIN TOTAL: 0.8 mg/dL (ref 0.3–1.2)
BUN: 12 mg/dL (ref 8–23)
CO2: 23 mmol/L (ref 22–32)
CREATININE: 1.11 mg/dL (ref 0.61–1.24)
Calcium: 8.4 mg/dL — ABNORMAL LOW (ref 8.9–10.3)
Chloride: 108 mmol/L (ref 98–111)
Glucose, Bld: 109 mg/dL — ABNORMAL HIGH (ref 70–99)
Potassium: 3.6 mmol/L (ref 3.5–5.1)
Sodium: 139 mmol/L (ref 135–145)
TOTAL PROTEIN: 5.9 g/dL — AB (ref 6.5–8.1)

## 2017-09-08 LAB — GLUCOSE, CAPILLARY
GLUCOSE-CAPILLARY: 72 mg/dL (ref 70–99)
GLUCOSE-CAPILLARY: 206 mg/dL — AB (ref 70–99)
Glucose-Capillary: 106 mg/dL — ABNORMAL HIGH (ref 70–99)
Glucose-Capillary: 94 mg/dL (ref 70–99)

## 2017-09-08 MED ORDER — METOPROLOL TARTRATE 25 MG PO TABS
25.0000 mg | ORAL_TABLET | Freq: Three times a day (TID) | ORAL | Status: DC
  Administered 2017-09-08 – 2017-09-09 (×3): 25 mg via ORAL
  Filled 2017-09-08 (×3): qty 1

## 2017-09-08 MED ORDER — APIXABAN 5 MG PO TABS
5.0000 mg | ORAL_TABLET | Freq: Two times a day (BID) | ORAL | Status: DC
  Administered 2017-09-09: 5 mg via ORAL
  Filled 2017-09-08: qty 1

## 2017-09-08 NOTE — Progress Notes (Signed)
ANTICOAGULATION CONSULT NOTE - Initial Consult  Pharmacy Consult for apixaban Indication: atrial fibrillation  No Known Allergies  Patient Measurements: Height: 5\' 9"  (175.3 cm) Weight: 193 lb 9 oz (87.8 kg) IBW/kg (Calculated) : 70.7 Heparin Dosing Weight:   Vital Signs: Temp: 98.4 F (36.9 C) (07/23 1414) Temp Source: Oral (07/23 1414) BP: 140/96 (07/23 1414) Pulse Rate: 95 (07/23 1414)  Labs: Recent Labs    09/06/17 1834 09/07/17 0334 09/08/17 0833  HGB 11.7* 12.0* 11.4*  HCT 34.9* 37.3* 35.1*  PLT 185 203 173  CREATININE 1.59* 1.47* 1.11    Estimated Creatinine Clearance: 63 mL/min (by C-G formula based on SCr of 1.11 mg/dL).   Medical History: Past Medical History:  Diagnosis Date  . Arthritis   . Atrial fibrillation ([place])   . Bladder cancer ([place])   . Colon cancer ([place]) 1997  . Diabetes mellitus without complication ([place])   . Dysrhythmia    newly diagnosed with afib 03/2016. Dr Percival Spanish.  Marland Kitchen History of kidney stones   . Hypertension   . Scoliosis     Medications:  Scheduled:  . Chlorhexidine Gluconate Cloth  6 each Topical Daily  . feeding supplement (PRO-STAT SUGAR FREE 64)  30 mL Oral BID  . insulin aspart  0-5 Units Subcutaneous QHS  . insulin aspart  0-9 Units Subcutaneous TID WC  . metoprolol tartrate  25 mg Oral TID  . mupirocin ointment  1 application Nasal BID  . zolpidem  5 mg Oral QHS    Assessment: Pharmacy is consulted to dose apixaban in 77 yo male diagnosed with afib. Pt was previously on Xarelto, but pt stopped taking due to hematuria. Pt will stop aspirin and clopidogrel upto starting apixaban to try to minimize bleeding risk. Hgb low but stable, plt stable.    Goal of Therapy:  Monitor platelets by anticoagulation protocol: Yes   Plan:   Apixaban 5 mg PO BID, starting 7/24 as per consult  Education provided  Manufacturer discount card provided   Royetta Asal, PharmD, BCPS Pager [phone] 09/08/2017 3:03 PM

## 2017-09-08 NOTE — Discharge Instructions (Signed)

## 2017-09-08 NOTE — Progress Notes (Signed)
[facility] Progress Note  Patient: Ryan Peters KDT:[id number]   PCP: Lenard Simmer, MD DOB: September 21, 1940   DOA: 09/06/2017   DOS: 09/08/2017   Date of Service: the patient was seen and examined on 09/08/2017  Subjective: Feeling better, no nausea no vomiting.  In a better mood slept good last night.  No diarrhea no constipation.  No burning urination.  No blood in the urine.  Brief hospital course: Pt. with PMH of HTN, type II DM, A. fib, colon cancer, urothelial cancer S/P resection, S/P radiation and chemotherapy, recent cystoscopy; admitted on 09/06/2017, presented with complaint of fever and weakness, was found to have sepsis due to UTI. Currently further plan is continue IV antibiotics.  Assessment and Plan: 1.  Sepsis due to UTI from Klebsiella Recent cystoscopy with bladder extension. Informed urology. Urine cultures growing Klebsiella, sensitivity currently pending. Blood cultures currently no growth for 24 hours Still febrile T-max 101.  9 PM 09/07/2017 Leukocytosis getting better. Blood pressure getting better heart rate getting better. Continue to monitor on telemetry Will need to monitor the blood cultures for 48 hours. On IV vancomycin and IV ceftriaxone. Continue IV vancomycin.  Continue ceftriaxone until sensitivity is back.  2.  Paroxysmal A. fib with RVR. Currently in A. fib Rate is not well controlled. Echocardiogram pending. Cardiology consulted. Patient is on aspirin and Plavix at home Was on Xarelto in the past and developed hematuria, cardiology recommended to consider therapeutic anticoagulation with Eliquis for the patient. Discussed with urology and recommend that as long as the patient is off of aspirin and Plavix which can try Eliquis and see if the patient can tolerate it. Inform cardiology regarding this decision and recommended to stop the aspirin and Plavix today and start Eliquis tomorrow. Discontinuing DVT prophylaxis as well. Patient was on  Actonel for hypertension, currently transition to oral Lopressor 25 mg 3 times daily for rate control.  3.  Urothelial cancer  Chronic kidney disease stage III Outpatient follow-up with urology.  4.  Type 2 diabetes mellitus. With chronic kidney disease stage III nephropathy. Continue sliding scale insulin. Holding Januvia. Holding metformin.   Also holding lisinopril for now.  5.  Insomnia and anxiety. Ambien at night  Diet: carb modified DVT Prophylaxis: subcutaneous Heparin  Advance goals of care discussion: full code  Family Communication: no family was present at bedside, at the time of interview.  Disposition:  Discharge to home likely tomorrow pending rate control, stable CBC, echocardiogram report, culture negativity.  Consultants: cardiology, urology Procedures: Echocardiogram   Antibiotics: Anti-infectives (From admission, onward)   Start     Dose/Rate Route Frequency Ordered Stop   09/07/17 1200  vancomycin (VANCOCIN) IVPB 1000 mg/200 mL premix  Status:  Discontinued     1,000 mg 200 mL/hr over 60 Minutes Intravenous Every 24 hours 09/06/17 2219 09/08/17 1340   09/06/17 2359  cefTRIAXone (ROCEPHIN) 1 g in sodium chloride 0.9 % 100 mL IVPB     1 g 200 mL/hr over 30 Minutes Intravenous Every 24 hours 09/06/17 2027     09/06/17 1815  piperacillin-tazobactam (ZOSYN) IVPB 3.375 g     3.375 g 100 mL/hr over 30 Minutes Intravenous  Once 09/06/17 1806 09/06/17 1904   09/06/17 1815  vancomycin (VANCOCIN) IVPB 1000 mg/200 mL premix     1,000 mg 200 mL/hr over 60 Minutes Intravenous  Once 09/06/17 1806 09/06/17 2004       Objective: Physical Exam: Vitals:   09/07/17 2147 09/08/17 0541 09/08/17 0550 09/08/17  1414  BP: (!) 118/96  119/76 (!) 140/96  Pulse: 99  82 95  Resp: 16  17 16   Temp: (!) 101 F (38.3 C)  98.6 F (37 C) 98.4 F (36.9 C)  TempSrc: Oral  Oral Oral  SpO2: 98%  100% 100%  Weight:  87.8 kg (193 lb 9 oz)    Height:        Intake/Output  Summary (Last 24 hours) at 09/08/2017 1802 Last data filed at 09/08/2017 1400 Gross per 24 hour  Intake 2399.99 ml  Output 200 ml  Net 2199.99 ml   Filed Weights   09/06/17 2123 09/07/17 0429 09/08/17 0541  Weight: 87 kg (191 lb 12.8 oz) 87 kg (191 lb 12.8 oz) 87.8 kg (193 lb 9 oz)   General: Alert, Awake and Oriented to Time, Place and Person. Appear in mild distress, affect appropriate Eyes: PERRL, Conjunctiva normal ENT: Oral Mucosa clear moist. Neck: no JVD, no Abnormal Mass Or lumps Cardiovascular: S1 and S2 Present, no Murmur, Peripheral Pulses Present Respiratory: normal respiratory effort, Bilateral Air entry equal and Decreased, no use of accessory muscle, Clear to Auscultation, no Crackles, no wheezes Abdomen: Bowel Sound present, Soft and no tenderness, no hernia Skin: no redness, no Rash, no induration Extremities: no Pedal edema, no calf tenderness Neurologic: Grossly no focal neuro deficit. Bilaterally Equal motor strength  Data Reviewed: CBC: Recent Labs  Lab 09/06/17 1834 09/07/17 0334 09/08/17 0833  WBC 9.9 11.8* 6.7  NEUTROABS 8.8*  --  5.7  HGB 11.7* 12.0* 11.4*  HCT 34.9* 37.3* 35.1*  MCV 90.4 91.0 90.5  PLT 185 203 532   Basic Metabolic Panel: Recent Labs  Lab 09/06/17 1834 09/07/17 0334 09/08/17 0833  NA 131* 139 139  K 3.6 4.1 3.6  CL 104 108 108  CO2 20* 24 23  GLUCOSE 224* 113* 109*  BUN 20 19 12   CREATININE 1.59* 1.47* 1.11  CALCIUM 7.7* 8.3* 8.4*    Liver Function Tests: Recent Labs  Lab 09/06/17 1834 09/07/17 0334 09/08/17 0833  AST 14* 17 16  ALT 8 10 12   ALKPHOS 37* 42 39  BILITOT 1.1 1.1 0.8  PROT 5.5* 6.4* 5.9*  ALBUMIN 2.8* 3.1* 2.9*   No results for input(s): LIPASE, AMYLASE in the last 168 hours. No results for input(s): AMMONIA in the last 168 hours. Coagulation Profile: No results for input(s): INR, PROTIME in the last 168 hours. Cardiac Enzymes: No results for input(s): CKTOTAL, CKMB, CKMBINDEX, TROPONINI in the  last 168 hours. BNP (last 3 results) No results for input(s): PROBNP in the last 8760 hours. CBG: Recent Labs  Lab 09/07/17 1613 09/07/17 2152 09/08/17 0755 09/08/17 1144 09/08/17 1708  GLUCAP 98 80 72 206* 106*   Studies: No results found.  Scheduled Meds: . [START ON 09/09/2017] apixaban  5 mg Oral BID  . Chlorhexidine Gluconate Cloth  6 each Topical Daily  . feeding supplement (PRO-STAT SUGAR FREE 64)  30 mL Oral BID  . insulin aspart  0-5 Units Subcutaneous QHS  . insulin aspart  0-9 Units Subcutaneous TID WC  . metoprolol tartrate  25 mg Oral TID  . mupirocin ointment  1 application Nasal BID  . zolpidem  5 mg Oral QHS   Continuous Infusions: . sodium chloride 100 mL/hr at 09/08/17 0858  . cefTRIAXone (ROCEPHIN)  IV Stopped (09/08/17 0000)   PRN Meds: acetaminophen **OR** acetaminophen  Time spent: 35 minutes  Author: Berle Mull, MD [facility] Pager: [phone] 09/08/2017 6:02 PM  If 7PM-7AM, please contact night-coverage at [email or web address], password [facility]

## 2017-09-08 NOTE — Progress Notes (Signed)
Progress Note  Patient Name: Ryan Peters Date of Encounter: 09/08/2017  Primary Cardiologist: Minus Breeding, MD   Subjective   Denies any chest pain or SOB. Rate controlled in the 90's in afib  Inpatient Medications    Scheduled Meds: . aspirin EC  81 mg Oral Daily  . atenolol  25 mg Oral BID  . Chlorhexidine Gluconate Cloth  6 each Topical Daily  . clopidogrel  75 mg Oral Q breakfast  . enoxaparin (LOVENOX) injection  40 mg Subcutaneous Q24H  . feeding supplement (PRO-STAT SUGAR FREE 64)  30 mL Oral BID  . insulin aspart  0-5 Units Subcutaneous QHS  . insulin aspart  0-9 Units Subcutaneous TID WC  . mupirocin ointment  1 application Nasal BID  . zolpidem  5 mg Oral QHS   Continuous Infusions: . sodium chloride 100 mL/hr at 09/08/17 0858  . cefTRIAXone (ROCEPHIN)  IV Stopped (09/08/17 0000)  . vancomycin Stopped (09/07/17 1224)   PRN Meds: acetaminophen **OR** acetaminophen   Vital Signs    Vitals:   09/07/17 1657 09/07/17 2147 09/08/17 0541 09/08/17 0550  BP: 139/89 (!) 118/96  119/76  Pulse: (!) 110 99  82  Resp: 16 16  17   Temp: 99.7 F (37.6 C) (!) 101 F (38.3 C)  98.6 F (37 C)  TempSrc: Oral Oral  Oral  SpO2: 99% 98%  100%  Weight:   193 lb 9 oz (87.8 kg)   Height:        Intake/Output Summary (Last 24 hours) at 09/08/2017 1039 Last data filed at 09/08/2017 1275 Gross per 24 hour  Intake 2636.66 ml  Output 400 ml  Net 2236.66 ml   Filed Weights   09/06/17 2123 09/07/17 0429 09/08/17 0541  Weight: 191 lb 12.8 oz (87 kg) 191 lb 12.8 oz (87 kg) 193 lb 9 oz (87.8 kg)    Telemetry    Atrial fibrillation with CVR at 97bpm - Personally Reviewed  ECG    No new EKG to review - Personally Reviewed  Physical Exam   GEN: No acute distress.   Neck: No JVD Cardiac: irregularly irregular, no murmurs, rubs, or gallops.  Respiratory: Clear to auscultation bilaterally. GI: Soft, nontender, non-distended  MS: No edema; No deformity. Neuro:  Nonfocal   Psych: Normal affect   Labs    Chemistry Recent Labs  Lab 09/06/17 1834 09/07/17 0334 09/08/17 0833  NA 131* 139 139  K 3.6 4.1 3.6  CL 104 108 108  CO2 20* 24 23  GLUCOSE 224* 113* 109*  BUN 20 19 12   CREATININE 1.59* 1.47* 1.11  CALCIUM 7.7* 8.3* 8.4*  PROT 5.5* 6.4* 5.9*  ALBUMIN 2.8* 3.1* 2.9*  AST 14* 17 16  ALT 8 10 12   ALKPHOS 37* 42 39  BILITOT 1.1 1.1 0.8  GFRNONAA 41* 45* >60  GFRAA 47* 52* >60  ANIONGAP 7 7 8      Hematology Recent Labs  Lab 09/06/17 1834 09/07/17 0334 09/08/17 0833  WBC 9.9 11.8* 6.7  RBC 3.86* 4.10* 3.88*  HGB 11.7* 12.0* 11.4*  HCT 34.9* 37.3* 35.1*  MCV 90.4 91.0 90.5  MCH 30.3 29.3 29.4  MCHC 33.5 32.2 32.5  RDW 14.4 14.5 14.4  PLT 185 203 173    Cardiac EnzymesNo results for input(s): TROPONINI in the last 168 hours. No results for input(s): TROPIPOC in the last 168 hours.   BNPNo results for input(s): BNP, PROBNP in the last 168 hours.   DDimer No results for  input(s): DDIMER in the last 168 hours.   Radiology    Dg Chest 2 View  Result Date: 09/06/2017 CLINICAL DATA:  Cough and fever EXAM: CHEST - 2 VIEW COMPARISON:  08/24/2017 FINDINGS: The heart size and mediastinal contours are within normal limits. Both lungs are clear. The visualized skeletal structures are unremarkable. IMPRESSION: No active cardiopulmonary disease. Electronically Signed   By: Ulyses Jarred M.D.   On: 09/06/2017 19:09    Cardiac Studies   Echo pending   Patient Profile     77 y.o. male with a hx of atrial fibrillation, T2DM, HTN, mild systolic HF, remote h/o colon cancer and recent bladder cancer s/p resection, radiation and chemo in 2018,  who is being seen today for the evaluation of atrial fibrillation w/ RVR, in the setting of UTI, at the request of Dr. Posey Pronto, Internal Medicine.  Assessment & Plan    1. Atrial Fibrillation w/ RVR:  -known h/o afib, first discovered in 2018 prior to undergoing bladder surgery. -CHA2DS2 VASc score > 2  (HTN, DM and HF). However, pt failed to f/u after surgery.  He did not tolerate Xarelto due to significant hematuria. His PCP subsequently placed him on ASA and Plavix. He has tolerated this ok w/o recurrent hematuria.  -Now admitted for UTI and rapid afib. Suspect rapid ventricular response is likely driven by underlying infection.  -Would avoid addition of CCB given mildly reduced EF.  -ASA and Plavix is not typically routinely used for primary prevention of stroke/TIA for atrial fibrillation.  -keep K+>4 -change atenolol to lopressor and increase to 25mg  TID for better rate control -recommend starting Eliquis if Urology ok with this  2. UTI: UA with WBC >50 and nitrate +. Urine culture pending. Antibiotics per IM. Currently on broad spectrum. IM to narrow down antibiotics based on culture growth.   3. Systolic HF -echo in 03/2295 showed mildly reduced LVEF at 45-50% with diffuse hypokinesis. No ischemic w/u pursued in 2018.  -This may be tachy mediated from atrial fibrillation.  -appears euvolemic on exam today.   -He put out 500cc yesterday and is net +4L -creatinine stable at 1.11 -continue BB -repeat echo pending  4. Renal Insuffiencey: SCr 1.59>>1.47>>1.11  5. H/o Bladder Cancer: treated with resection, radiation and chemo in 2018. Followed by urology.       For questions or updates, please contact [place] Please consult [email or web address] for contact info under Cardiology/STEMI.      Signed, Fransico Him, MD  09/08/2017, 10:39 AM

## 2017-09-09 ENCOUNTER — Inpatient Hospital Stay (HOSPITAL_COMMUNITY): Payer: Medicare Other

## 2017-09-09 DIAGNOSIS — I5022 Chronic systolic (congestive) heart failure: Secondary | ICD-10-CM

## 2017-09-09 DIAGNOSIS — I4891 Unspecified atrial fibrillation: Secondary | ICD-10-CM

## 2017-09-09 LAB — BLOOD CULTURE ID PANEL (REFLEXED)
ACINETOBACTER BAUMANNII: NOT DETECTED
CANDIDA ALBICANS: NOT DETECTED
CANDIDA TROPICALIS: NOT DETECTED
Candida glabrata: NOT DETECTED
Candida krusei: NOT DETECTED
Candida parapsilosis: NOT DETECTED
Carbapenem resistance: NOT DETECTED
ENTEROBACTER CLOACAE COMPLEX: NOT DETECTED
ENTEROBACTERIACEAE SPECIES: DETECTED — AB
Enterococcus species: NOT DETECTED
Escherichia coli: NOT DETECTED
HAEMOPHILUS INFLUENZAE: NOT DETECTED
Klebsiella oxytoca: NOT DETECTED
Klebsiella pneumoniae: DETECTED — AB
Listeria monocytogenes: NOT DETECTED
Neisseria meningitidis: NOT DETECTED
PROTEUS SPECIES: NOT DETECTED
PSEUDOMONAS AERUGINOSA: NOT DETECTED
STREPTOCOCCUS AGALACTIAE: NOT DETECTED
STREPTOCOCCUS SPECIES: NOT DETECTED
Serratia marcescens: NOT DETECTED
Staphylococcus aureus (BCID): NOT DETECTED
Staphylococcus species: NOT DETECTED
Streptococcus pneumoniae: NOT DETECTED
Streptococcus pyogenes: NOT DETECTED

## 2017-09-09 LAB — COMPREHENSIVE METABOLIC PANEL
ALT: 14 U/L (ref 0–44)
ANION GAP: 8 (ref 5–15)
AST: 17 U/L (ref 15–41)
Albumin: 2.7 g/dL — ABNORMAL LOW (ref 3.5–5.0)
Alkaline Phosphatase: 41 U/L (ref 38–126)
BILIRUBIN TOTAL: 0.6 mg/dL (ref 0.3–1.2)
BUN: 9 mg/dL (ref 8–23)
CO2: 24 mmol/L (ref 22–32)
Calcium: 8.1 mg/dL — ABNORMAL LOW (ref 8.9–10.3)
Chloride: 106 mmol/L (ref 98–111)
Creatinine, Ser: 0.99 mg/dL (ref 0.61–1.24)
GFR calc Af Amer: >60 mL/min (ref >60)
Glucose, Bld: 93 mg/dL (ref 70–99)
POTASSIUM: 3.1 mmol/L — AB (ref 3.5–5.1)
Sodium: 138 mmol/L (ref 135–145)
TOTAL PROTEIN: 5.6 g/dL — AB (ref 6.5–8.1)

## 2017-09-09 LAB — URINE CULTURE: Culture: >=100000 — AB

## 2017-09-09 LAB — GLUCOSE, CAPILLARY
Glucose-Capillary: 87 mg/dL (ref 70–99)
Glucose-Capillary: 102 mg/dL — ABNORMAL HIGH (ref 70–99)
Glucose-Capillary: 103 mg/dL — ABNORMAL HIGH (ref 70–99)

## 2017-09-09 LAB — CBC WITH DIFFERENTIAL/PLATELET
Basophils Absolute: 0 10*3/uL (ref 0.0–0.1)
Basophils Relative: 0 %
EOS PCT: 0 %
Eosinophils Absolute: 0 10*3/uL (ref 0.0–0.7)
HCT: 33.9 % — ABNORMAL LOW (ref 39.0–52.0)
Hemoglobin: 11.1 g/dL — ABNORMAL LOW (ref 13.0–17.0)
LYMPHS ABS: 0.5 10*3/uL — AB (ref 0.7–4.0)
LYMPHS PCT: 7 %
MCH: 29.5 pg (ref 26.0–34.0)
MCHC: 32.7 g/dL (ref 30.0–36.0)
MCV: 90.2 fL (ref 78.0–100.0)
MONO ABS: 0.9 10*3/uL (ref 0.1–1.0)
Monocytes Relative: 12 %
Neutro Abs: 5.7 10*3/uL (ref 1.7–7.7)
Neutrophils Relative %: 81 %
PLATELETS: 198 10*3/uL (ref 150–400)
RBC: 3.76 MIL/uL — AB (ref 4.22–5.81)
RDW: 14.2 % (ref 11.5–15.5)
WBC: 7.1 10*3/uL (ref 4.0–10.5)

## 2017-09-09 LAB — ECHOCARDIOGRAM COMPLETE
Height: 69 in
Weight: 3086.44 oz

## 2017-09-09 LAB — MAGNESIUM: MAGNESIUM: 1.3 mg/dL — AB (ref 1.7–2.4)

## 2017-09-09 MED ORDER — METOPROLOL TARTRATE 50 MG PO TABS: 50.0000 mg | ORAL_TABLET | Freq: Two times a day (BID) | ORAL | Status: DC

## 2017-09-09 MED ORDER — METOPROLOL TARTRATE 50 MG PO TABS: 50.0000 mg | ORAL_TABLET | Freq: Two times a day (BID) | ORAL | 0 refills | Status: DC

## 2017-09-09 MED ORDER — APIXABAN 5 MG PO TABS: 5.0000 mg | ORAL_TABLET | Freq: Two times a day (BID) | ORAL | 0 refills | Status: DC

## 2017-09-09 MED ORDER — PERFLUTREN LIPID MICROSPHERE
INTRAVENOUS | Status: AC
  Filled 2017-09-09: qty 10

## 2017-09-09 MED ORDER — AMOXICILLIN-POT CLAVULANATE 875-125 MG PO TABS: 1.0000 | ORAL_TABLET | Freq: Two times a day (BID) | ORAL | Status: DC

## 2017-09-09 MED ORDER — MUPIROCIN 2 % EX OINT: 1.0000 "application " | TOPICAL_OINTMENT | Freq: Two times a day (BID) | CUTANEOUS | 0 refills | Status: DC

## 2017-09-09 MED ORDER — AMOXICILLIN-POT CLAVULANATE 875-125 MG PO TABS: 1.0000 | ORAL_TABLET | Freq: Two times a day (BID) | ORAL | 0 refills | Status: DC

## 2017-09-09 MED ORDER — CEPHALEXIN 500 MG PO CAPS: 500.0000 mg | ORAL_CAPSULE | Freq: Two times a day (BID) | ORAL | 0 refills | Status: DC

## 2017-09-09 MED ORDER — SODIUM CHLORIDE 0.9 % IV SOLN
2.0000 g | Freq: Every day | INTRAVENOUS | Status: DC
  Administered 2017-09-09: 2 g via INTRAVENOUS
  Filled 2017-09-09: qty 2

## 2017-09-09 MED ORDER — PRO-STAT SUGAR FREE PO LIQD: 30.0000 mL | Freq: Two times a day (BID) | ORAL | 0 refills | Status: DC

## 2017-09-09 MED ORDER — PERFLUTREN LIPID MICROSPHERE
1.0000 mL | INTRAVENOUS | Status: AC | PRN
  Administered 2017-09-09: 2 mL via INTRAVENOUS
  Filled 2017-09-09: qty 10

## 2017-09-09 MED ORDER — METOPROLOL TARTRATE 50 MG PO TABS: 75.0000 mg | ORAL_TABLET | Freq: Two times a day (BID) | ORAL | 0 refills | Status: DC

## 2017-09-09 NOTE — Discharge Summary (Addendum)
Ryan Peters, is a 77 y.o. male  DOB 10/01/1940  MRN [id number].  Admission date:  09/06/2017  Admitting Physician  Jani Gravel, MD  Discharge Date:  09/09/2017   Primary MD  Lenard Simmer, MD  Recommendations for primary care physician for things to follow:   1. Sepsis due to UTIfrom Klebsiella Recent cystoscopy with bladder extension. Informedurology. Urine cultures 7/21=> Klebsiella, sensitive to cefazolin. Blood cultures 7/21=>  + klebsiella, enterobacter Augmentin 857m po bid  x 7 days  2. Paroxysmal A. fib with RVR. Currentlyin A. fib Echo 7/24=> EF 45-50%,  Cont metoprolol 753mpo bid Cont Eliquis 25m42mo bid Check cbc in 1 week with pcp  3. Urothelial cancer  Chronic kidney disease stage III Outpatient follow-up with urology in 2 weeks  4. Type 2 diabetes mellitus. With chronic kidney disease stage III nephropathy. Resume Janumet Check cmp in 1 week with pcp If creatinine >1.5 then please choose different medication   5. Severe protein calorie malnutrition Cont Prostat 21m725m bid    Admission Diagnosis  SIRS (systemic inflammatory response syndrome) (HCC) [R65.10] Acute cystitis with hematuria [N30.01] Atrial fibrillation, unspecified type (HCC)[place]48.91] Fever [R50.9]   Discharge Diagnosis  SIRS (systemic inflammatory response syndrome) (HCC) [R65.10] Acute cystitis with hematuria [N30.01] Atrial fibrillation, unspecified type (HCC)[place]48.91] Fever [R50.9]    Principal Problem:   Fever Active Problems:   Acute lower UTI   Atrial fibrillation with RVR (HCC)   DCM (dilated cardiomyopathy) (HCC)[place]   Past Medical History:  Diagnosis Date  . Arthritis   . Atrial fibrillation (HCC)[place]. Bladder cancer (HCC)[place]. Colon cancer (HCC)[place]  . Diabetes mellitus without complication (HCC)[place]. Dysrhythmia    newly diagnosed with afib 03/2016. Dr HochPercival Spanish  HMarland Kitchenstory of kidney stones   . Hypertension   . Scoliosis     Past Surgical History:  Procedure Laterality Date  . APPENDECTOMY    . CHOLECYSTECTOMY    . COLECTOMY  1997  . CYSTOSCOPY W/ URETERAL STENT PLACEMENT N/A 04/09/2016   Procedure: CYSTOSCOPY WITH RETROGRADE PYELOGRAM/;  Surgeon: TheoAlexis Frock;  Location: [facility];  Service: Urology;  Laterality: N/A;  . TONSILLECTOMY    . 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 Pt presented today due to fever. He has had slight dysuria, since cystoscopy 2 weeks ago. Pt denies flank pain, n/v, diarrhea, brbpr, hematuria.   In ED,   CXR IMPRESSION: No active cardiopulmonary disease.  Na 131, K 3.6, Bun 20, Creatinine 1.59 Ast 14, Alt 8 Alk phos  37, T. Bili 1.1 Glucose 224 Alb 2.8  Wbc 9.9, Hgb 11.7, Plt 185  Urinalysis nitrite +, LE + Wbc >50, rbc 11-20 Lactic acid 1.2 Urine culture pending, blood culture pending   Ekg Afib at 121, Current heart rate 90  Pt will be admitted for fever secondary to UTI, ? Early sepsis        Hospital Course:     Pt was admitted for urosepsis and started on rocephin 1gm iv qday. And vanco iv pharmacy to dose.  Urine culture grew out klebsiella sensative to rocephin.  Blood culture grew klebsiella as well.  Thus vanco was discontinued 7/22.   Afib with RVR resolved with metoprolol 65m iv x1.  Pt was continued on atenolol and aspirin and plavix.  Cardiology consulted and recommended anticoagulation with Eliquis and urology was in agreement. Aspirin and plavix  discontinued 7/23 Echo 7/24=> EF 45-50%.  Pt tolerating eliquis,  No bleeding.  Pt also converted to metoprolol and  increased to 718mpo bid for rate control.   Pt appears stable and will be discharged to home.     Follow UP  Follow-up Information    [place], ShLourdes SledgeMD Follow up in 1 week(s).   Specialty:  Endocrinology Contact information: [address] [phone]        HoMinus BreedingMD Follow up in 1 week(s).   Specialty:  Cardiology Contact information: [phone]        MaAlexis FrockMD Follow up in 2 week(s).   Specialty:  Urology Contact information: [address] [phone]          Consults obtained - cardiology, urology  Discharge Condition: stable  Diet and Activity recommendation: See Discharge Instructions below  Discharge Instructions         Discharge Medications     Allergies as of 09/09/2017   No Known Allergies     Medication List    STOP taking these medications   aspirin EC 81 MG tablet   atenolol 25 MG tablet Commonly known as:  TENORMIN   clopidogrel 75 MG tablet Commonly known as:  PLAVIX   lisinopril 5 MG tablet Commonly known as:  PRINIVIL,ZESTRIL   naproxen sodium 220 MG tablet Commonly known as:  ALEVE   prochlorperazine 10 MG tablet Commonly known as:  COMPAZINE     TAKE these medications   apixaban 5 MG Tabs tablet Commonly known as:  ELIQUIS Take 1 tablet (5 mg total) by mouth 2 (two) times daily.   feeding supplement (PRO-STAT SUGAR FREE 64) Liqd Take 30 mLs by mouth 2 (two) times daily.   metoprolol tartrate 50 MG tablet Commonly known as:  LOPRESSOR Take 1 tablet (50 mg total) by mouth 2 (two) times daily.   mupirocin ointment 2 % Commonly known as:  BACTROBAN Place 1 application into the nose 2 (two) times daily.   sitaGLIPtin-metformin 50-1000 MG tablet Commonly known as:   JANUMET Take 1 tablet by mouth daily.       Major procedures and Radiology Reports - PLEASE review detailed and final reports for all details, in brief -       Dg Chest 2 View  Result Date: 09/06/2017 CLINICAL DATA:  Cough and fever EXAM: CHEST - 2 VIEW COMPARISON:  08/24/2017 FINDINGS: The heart size and mediastinal contours are within normal limits. Both lungs are clear. The visualized skeletal structures are unremarkable.  IMPRESSION: No active cardiopulmonary disease. Electronically Signed   By: Ulyses Jarred M.D.   On: 09/06/2017 19:09   Dg Chest 2 View  Result Date: 08/24/2017 CLINICAL DATA:  Bladder cancer 1 year follow-up with cough and congestion. EXAM: CHEST - 2 VIEW COMPARISON:  Chest CT 06/09/2016 FINDINGS: Normal heart size. Tortuous aorta without aneurysmal dilatation. Mild hyperinflation of the lungs without pulmonary consolidation, CHF, effusion or pneumothorax. No acute osseous abnormality. Dextroconvex curvature of the included lumbar spine. IMPRESSION: No active cardiopulmonary disease. Electronically Signed   By: Ashley Royalty M.D.   On: 08/24/2017 14:17    Micro Results      Recent Results (from the past 240 hour(s))  Urine culture     Status: Abnormal   Collection Time: 09/06/17  6:34 PM  Result Value Ref Range Status   Specimen Description   Final    URINE, CLEAN CATCH Performed at [facility], [place] [address] [place], [place] [address]    Special Requests   Final    NONE Performed at [facility], [place] [address], [place], [place] [address]    Culture >=100,000 COLONIES/mL KLEBSIELLA PNEUMONIAE (A)  Final   Report Status 09/09/2017 FINAL  Final   Organism ID, Bacteria KLEBSIELLA PNEUMONIAE (A)  Final      Susceptibility   Klebsiella pneumoniae - MIC*    AMPICILLIN RESISTANT Resistant     CEFAZOLIN <=4 SENSITIVE Sensitive     CEFTRIAXONE <=1 SENSITIVE Sensitive     CIPROFLOXACIN <=0.25 SENSITIVE Sensitive      GENTAMICIN <=1 SENSITIVE Sensitive     IMIPENEM <=0.25 SENSITIVE Sensitive     NITROFURANTOIN 64 INTERMEDIATE Intermediate     TRIMETH/SULFA <=20 SENSITIVE Sensitive     AMPICILLIN/SULBACTAM 4 SENSITIVE Sensitive     PIP/TAZO 8 SENSITIVE Sensitive     Extended ESBL NEGATIVE Sensitive     * >=100,000 COLONIES/mL KLEBSIELLA PNEUMONIAE  Blood Culture (routine x 2)     Status: None (Preliminary result)   Collection Time: 09/06/17  6:35 PM  Result Value Ref Range Status   Specimen Description   Final    BLOOD BLOOD LEFT FOREARM Performed at [place] [address] [place], [place] [address]    Special Requests   Final    BOTTLES DRAWN AEROBIC AND ANAEROBIC Blood Culture adequate volume Performed at [place] [address], [place], [place] [address]    Culture   Final    NO GROWTH 3 DAYS Performed at [facility], [place] [address], [place], [place] [address]    Report Status PENDING  Incomplete  Blood Culture (routine x 2)     Status: None (Preliminary result)   Collection Time: 09/06/17  6:35 PM  Result Value Ref Range Status   Specimen Description   Final    BLOOD BLOOD RIGHT HAND Performed at [place] [address] [place], [place] [address]    Special Requests   Final    BOTTLES DRAWN AEROBIC AND ANAEROBIC Blood Culture adequate volume Performed at [place] [address] [place], [place] [address]    Culture  Setup Time   Final    ANAEROBIC BOTTLE ONLY GRAM NEGATIVE RODS Organism ID to follow CRITICAL RESULT CALLED TO, READ BACK BY AND VERIFIED WITH: B GREEN PHARMD 09/09/17 0249 JDW    Culture   Final    NO GROWTH 3 DAYS Performed at [facility],  [address], [place], [place] [address]    Report Status PENDING  Incomplete  Blood Culture ID Panel (Reflexed)     Status: Abnormal   Collection Time: 09/06/17  6:35 PM  Result Value Ref Range Status   Enterococcus  species NOT DETECTED NOT DETECTED Final   Listeria monocytogenes NOT DETECTED NOT DETECTED Final   Staphylococcus species NOT DETECTED NOT DETECTED Final   Staphylococcus aureus NOT DETECTED NOT DETECTED Final   Streptococcus species NOT DETECTED NOT DETECTED Final   Streptococcus agalactiae NOT DETECTED NOT DETECTED Final   Streptococcus pneumoniae NOT DETECTED NOT DETECTED Final   Streptococcus pyogenes NOT DETECTED NOT DETECTED Final   Acinetobacter baumannii NOT DETECTED NOT DETECTED Final   Enterobacteriaceae species DETECTED (A) NOT DETECTED Final    Comment: Enterobacteriaceae represent a large family of gram-negative bacteria, not a single organism. CRITICAL RESULT CALLED TO, READ BACK BY AND VERIFIED WITH: B GREEN PHARMD 09/09/17 0249 JDW    Enterobacter cloacae complex NOT DETECTED NOT DETECTED Final   Escherichia coli NOT DETECTED NOT DETECTED Final   Klebsiella oxytoca NOT DETECTED NOT DETECTED Final   Klebsiella pneumoniae DETECTED (A) NOT DETECTED Final    Comment: CRITICAL RESULT CALLED TO, READ BACK BY AND VERIFIED WITH: B GREEN PHARMD 09/09/17 0249 JDW    Proteus species NOT DETECTED NOT DETECTED Final   Serratia marcescens NOT DETECTED NOT DETECTED Final   Carbapenem resistance NOT DETECTED NOT DETECTED Final   Haemophilus influenzae NOT DETECTED NOT DETECTED Final   Neisseria meningitidis NOT DETECTED NOT DETECTED Final   Pseudomonas aeruginosa NOT DETECTED NOT DETECTED Final   Candida albicans NOT DETECTED NOT DETECTED Final   Candida glabrata NOT DETECTED NOT DETECTED Final   Candida krusei NOT DETECTED NOT DETECTED Final   Candida parapsilosis NOT DETECTED NOT DETECTED Final   Candida tropicalis NOT DETECTED NOT DETECTED Final    Comment: Performed at [facility], [place]. [address] [place], [place] [address]  MRSA PCR Screening     Status: Abnormal   Collection Time: 09/06/17  9:50 PM  Result Value Ref Range Status   MRSA by PCR POSITIVE (A) NEGATIVE Final     Comment:        The GeneXpert MRSA Assay (FDA approved for NASAL specimens only), is one component of a comprehensive MRSA colonization surveillance program. It is not intended to diagnose MRSA infection nor to guide or monitor treatment for MRSA infections. RESULT CALLED TO, READ BACK BY AND VERIFIED WITH: Heide Spark 09/07/17 0309 RHOLMES Performed at [facility] [place], [place] [address], [place], [place] [address]        Today   Subjective    Donney Rankins today denies dysuria, hematuria, flank pain, n/v, fever, chills.  Pt has no headache,no chest abdominal pain,no new weakness tingling or numbness, feels much better wants to go home today.   Objective   Blood pressure 125/73, pulse (!) 103, temperature 98.2 F (36.8 C), temperature source Oral, resp. rate 20, height _0  (1.753 m), weight 87.5 kg (192 lb 14.4 oz), SpO2 99 %.   Intake/Output Summary (Last 24 hours) at 09/09/2017 1723 Last data filed at 09/09/2017 1638 Gross per 24 hour  Intake 1947 ml  Output 3275 ml  Net -1328 ml    Exam Awake Alert, Oriented x 3, No new F.N deficits, Normal affect [place].AT,PERRAL Supple Neck,No JVD, No cervical lymphadenopathy appriciated.  Symmetrical Chest wall movement, Good air movement bilaterally, CTAB Irr, irr, s1, s2 +  ve B.Sounds, Abd Soft, Non tender, No organomegaly appriciated, No rebound -guarding or rigidity. No Cyanosis, Clubbing or edema, No new Rash or bruise   Data Review   CBC w Diff:  Lab Results  Component Value Date   WBC 7.1 09/09/2017   HGB 11.1 (L) 09/09/2017   HGB 14.1 09/05/2016   HCT 33.9 (L) 09/09/2017   HCT 41.2 09/05/2016   PLT 198 09/09/2017   PLT 180 09/05/2016   LYMPHOPCT 7 09/09/2017   LYMPHOPCT 12.3 (L) 09/05/2016   MONOPCT 12 09/09/2017   MONOPCT 10.6 09/05/2016   EOSPCT 0 09/09/2017   EOSPCT 0.4 09/05/2016   BASOPCT 0 09/09/2017   BASOPCT 0.2 09/05/2016    CMP:  Lab Results  Component Value Date   NA 138  09/09/2017   NA 138 09/05/2016   K 3.1 (L) 09/09/2017   K 4.6 09/05/2016   CL 106 09/09/2017   CL 103 03/30/2011   CO2 24 09/09/2017   CO2 20 (L) 09/05/2016   BUN 9 09/09/2017   BUN 13.8 09/05/2016   CREATININE 0.99 09/09/2017   CREATININE 1.2 09/05/2016   PROT 5.6 (L) 09/09/2017   PROT 6.6 09/05/2016   ALBUMIN 2.7 (L) 09/09/2017   ALBUMIN 3.8 09/05/2016   BILITOT 0.6 09/09/2017   BILITOT 0.66 09/05/2016   ALKPHOS 41 09/09/2017   ALKPHOS 49 09/05/2016   AST 17 09/09/2017   AST 11 09/05/2016   ALT 14 09/09/2017   ALT 8 09/05/2016  .   Total Time in preparing paper work, data evaluation and todays exam - 64 minutes  Jani Gravel M.D on 09/09/2017 at 5:23 PM  [facility]   Office  [phone]

## 2017-09-09 NOTE — Progress Notes (Signed)
Progress Note  Patient Name: Ryan Peters Date of Encounter: 09/09/2017  Primary Cardiologist: Minus Breeding, MD   Subjective   No complaints today. Denies any cardiac symptoms. We discussed anticoagulation and urology's recs. He is ok with giving Eliquis a try.   Inpatient Medications    Scheduled Meds: . apixaban  5 mg Oral BID  . Chlorhexidine Gluconate Cloth  6 each Topical Daily  . feeding supplement (PRO-STAT SUGAR FREE 64)  30 mL Oral BID  . insulin aspart  0-5 Units Subcutaneous QHS  . insulin aspart  0-9 Units Subcutaneous TID WC  . metoprolol tartrate  25 mg Oral TID  . mupirocin ointment  1 application Nasal BID  . zolpidem  5 mg Oral QHS   Continuous Infusions: . sodium chloride 100 mL/hr at 09/09/17 0728  . cefTRIAXone (ROCEPHIN)  IV     PRN Meds: acetaminophen **OR** acetaminophen   Vital Signs    Vitals:   09/08/17 0550 09/08/17 1414 09/08/17 2034 09/09/17 0622  BP: 119/76 (!) 140/96 (!) 139/92 116/84  Pulse: 82 95 95 96  Resp: 17 16 20 20   Temp: 98.6 F (37 C) 98.4 F (36.9 C) 100.1 F (37.8 C) 100 F (37.8 C)  TempSrc: Oral Oral Oral Oral  SpO2: 100% 100% 96% 98%  Weight:    192 lb 14.4 oz (87.5 kg)  Height:        Intake/Output Summary (Last 24 hours) at 09/09/2017 0810 Last data filed at 09/09/2017 0728 Gross per 24 hour  Intake 2900.33 ml  Output 1750 ml  Net 1150.33 ml   Filed Weights   09/07/17 0429 09/08/17 0541 09/09/17 0622  Weight: 191 lb 12.8 oz (87 kg) 193 lb 9 oz (87.8 kg) 192 lb 14.4 oz (87.5 kg)    Telemetry    Atrial fibrillation 90s- low 100s - Personally Reviewed  ECG    Not performed today - Personally Reviewed  Physical Exam   GEN: No acute distress.   Neck: No JVD Cardiac: irreguarlly irregular rhythm, normal rate, no murmurs, rubs, or gallops.  Respiratory: Clear to auscultation bilaterally. GI: Soft, nontender, non-distended  MS: No edema; No deformity. Neuro:  Nonfocal  Psych: Normal affect   Labs      Chemistry Recent Labs  Lab 09/07/17 0334 09/08/17 0833 09/09/17 0459  NA 139 139 138  K 4.1 3.6 3.1*  CL 108 108 106  CO2 24 23 24   GLUCOSE 113* 109* 93  BUN 19 12 9   CREATININE 1.47* 1.11 0.99  CALCIUM 8.3* 8.4* 8.1*  PROT 6.4* 5.9* 5.6*  ALBUMIN 3.1* 2.9* 2.7*  AST 17 16 17   ALT 10 12 14   ALKPHOS 42 39 41  BILITOT 1.1 0.8 0.6  GFRNONAA 45* >60 >60  GFRAA 52* >60 >60  ANIONGAP 7 8 8      Hematology Recent Labs  Lab 09/07/17 0334 09/08/17 0833 09/09/17 0459  WBC 11.8* 6.7 7.1  RBC 4.10* 3.88* 3.76*  HGB 12.0* 11.4* 11.1*  HCT 37.3* 35.1* 33.9*  MCV 91.0 90.5 90.2  MCH 29.3 29.4 29.5  MCHC 32.2 32.5 32.7  RDW 14.5 14.4 14.2  PLT 203 173 198    Cardiac EnzymesNo results for input(s): TROPONINI in the last 168 hours. No results for input(s): TROPIPOC in the last 168 hours.   BNPNo results for input(s): BNP, PROBNP in the last 168 hours.   DDimer No results for input(s): DDIMER in the last 168 hours.   Radiology    No  results found.  Cardiac Studies   2D Echo- pending   Patient Profile     77 y.o. male with a hx of atrial fibrillation, T2DM, HTN, mild systolic HF, remote h/o colon cancer and recent bladder cancer s/p resection, radiation and chemo in 2018,who is being seen today for the evaluation of atrial fibrillation w/ RVR, in the setting of UTI,at the request of Dr. Posey Pronto, Internal Medicine.  Assessment & Plan    1. Atrial Fibrillation w/ RVR: known h/o afib, first discovered in 2018 prior to undergoing bladder surgery. Pt not compliant with outpatient cardiology follow-up. Now with afib w/ RVR in the setting of UTI. Suspect RVR is driven by underlying infection, which is being treated with antibiotics. Resting V-rates improved and currently in the 90s (120s on admit). BB regimen changed on 7/23. Atenolol stopped and replaced with metoprolol, 25 mg TID. BP is stable and controlled at 116/84. We are avoiding Cardizem due to reduced EF. CHA2DS2 VASc  score is > 2 for HTN, DM and HF. We recommend anticoagulation for stroke prophylaxis. Pt unable to tolerate Xarelto in the past due to hematuria. Primary team has dicussed bleed risk with urology. They feel that the pt can try Eliquis, as long as ASA and Plavix are discontinued (she TRH rounding note from 7/23). We will stop ASA and Plavix and will plan on Eliquis. Based on dosing criteria using age, weight and SCr, recommend full dose of 5 mg BID. We will continue to follow along with you. Echo is pending.   2. UTI: Urine cultures growing Klebsiella. Blood cultures negative. Continue antibiotics per IM.   3. Chronic Systolic HF: echo in 02/270 showed mildly reduced LVEF at 45-50% with diffuse hypokinesis. No ischemic w/u pursued in 2018. This may be tachy mediated from atrial fibrillation. Repeat echo pending.   4. AKI: resolved. Admit SCr was 1.47. SCr now back to normal at 0.99 today.   5. H/o Bladder Cancer: treated with resection,radiation and chemo in 2018. Followed by urology.     For questions or updates, please contact [employer] Please consult [email or web address] for contact info under Cardiology/STEMI.      Signed, Lyda Jester, PA-C  09/09/2017, 8:10 AM

## 2017-09-09 NOTE — Progress Notes (Signed)
PHARMACY - PHYSICIAN COMMUNICATION CRITICAL VALUE ALERT - BLOOD CULTURE IDENTIFICATION (BCID)  Ryan Peters is an 77 y.o. male who presented to [facility] on 09/06/2017 with a chief complaint of weakness, fever  Assessment:  Urine (include suspected source if known) 1 of 4 bottles  Name of physician (or Provider) Contacted: Lamar Blinks, NP  Current antibiotics: Rocephin 1 Gm IV q24h  Changes to prescribed antibiotics recommended:  Recommendations accepted by provider  Increase Rocephin to 2 Gm IV q24h  Results for orders placed or performed during the hospital encounter of 09/06/17  Blood Culture ID Panel (Reflexed) (Collected: 09/06/2017  6:35 PM)  Result Value Ref Range   Enterococcus species NOT DETECTED NOT DETECTED   Listeria monocytogenes NOT DETECTED NOT DETECTED   Staphylococcus species NOT DETECTED NOT DETECTED   Staphylococcus aureus NOT DETECTED NOT DETECTED   Streptococcus species NOT DETECTED NOT DETECTED   Streptococcus agalactiae NOT DETECTED NOT DETECTED   Streptococcus pneumoniae NOT DETECTED NOT DETECTED   Streptococcus pyogenes NOT DETECTED NOT DETECTED   Acinetobacter baumannii NOT DETECTED NOT DETECTED   Enterobacteriaceae species DETECTED (A) NOT DETECTED   Enterobacter cloacae complex NOT DETECTED NOT DETECTED   Escherichia coli NOT DETECTED NOT DETECTED   Klebsiella oxytoca NOT DETECTED NOT DETECTED   Klebsiella pneumoniae DETECTED (A) NOT DETECTED   Proteus species NOT DETECTED NOT DETECTED   Serratia marcescens NOT DETECTED NOT DETECTED   Carbapenem resistance NOT DETECTED NOT DETECTED   Haemophilus influenzae NOT DETECTED NOT DETECTED   Neisseria meningitidis NOT DETECTED NOT DETECTED   Pseudomonas aeruginosa NOT DETECTED NOT DETECTED   Candida albicans NOT DETECTED NOT DETECTED   Candida glabrata NOT DETECTED NOT DETECTED   Candida krusei NOT DETECTED NOT DETECTED   Candida parapsilosis NOT DETECTED NOT DETECTED   Candida tropicalis NOT DETECTED NOT  DETECTED    Dorrene German 09/09/2017  2:53 AM

## 2017-09-09 NOTE — Progress Notes (Signed)
Pt discharged to with spouse, discharged instructions reviewed with patient and wife, acknowledged understanding of instruction. Reviewed med with pt. Pt stable. SRP,RN

## 2017-09-09 NOTE — Progress Notes (Signed)
  Echocardiogram 2D Echocardiogram has been performed.  Ryan Peters 09/09/2017, 2:52 PM

## 2017-09-09 NOTE — Progress Notes (Signed)
Patient ID: Ryan Peters, male   DOB: 1940/10/07, 77 y.o.   MRN: [id number]                                                                PROGRESS NOTE                                                                                                                                                                                                             Patient Demographics:    Ryan Peters, is a 77 y.o. male, DOB - [birth year], VCB:[id number]  Admit date - 09/06/2017   Admitting Physician Jani Gravel, MD  Outpatient Primary MD for the patient is Morayati, Lourdes Sledge, MD  LOS - 3  Outpatient Specialists:     Chief Complaint  Patient presents with  . Weakness       Brief Narrative    77 y.o. 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  Pt presented today due to fever.  He has had slight dysuria, since cystoscopy 2 weeks ago.  Pt denies flank pain, n/v, diarrhea, brbpr, hematuria.    In ED,   CXR IMPRESSION: No active cardiopulmonary disease.  Na 131, K 3.6, Bun 20, Creatinine 1.59 Ast 14, Alt 8 Alk phos 37, T. Bili 1.1 Glucose 224 Alb 2.8  Wbc 9.9, Hgb 11.7, Plt 185  Urinalysis nitrite +, LE + Wbc >50, rbc 11-20 Lactic acid 1.2 Urine culture pending, blood culture pending   Ekg Afib at 121,  Current heart rate 90  Pt will be admitted for fever secondary to UTI, ? Early sepsis        Subjective:    [name] today has been afebrile.  Denies dysuria, hematuria, flank pain, n/v, diarrhea.   No headache, No chest pain, No abdominal pain -No new weakness tingling or numbness, No Cough - SOB.    Assessment  & Plan :    Principal Problem:   Fever Active Problems:   Acute lower UTI   Atrial fibrillation with RVR (HCC)   DCM (dilated cardiomyopathy) ([place])  1.  Sepsis due to UTI from Klebsiella Recent cystoscopy with bladder  extension. Informed urology. Urine cultures growing Klebsiella, sensitivity currently pending. Blood cultures +  klebsiella Blood pressure getting better heart rate getting better. Continue to monitor on telemetry Will need to monitor the blood cultures for 48 hours. On IV vancomycin and IV ceftriaxone. Continue IV vancomycin.  Continue ceftriaxone until sensitivity is back.  2.  Paroxysmal A. fib with RVR. Currently in A. fib Rate is not well controlled. Echocardiogram pending. Cardiology consulted. Patient is on aspirin and Plavix at home Was on Xarelto in the past and developed hematuria, cardiology recommended to consider therapeutic anticoagulation with Eliquis for the patient. Discussed with urology and recommend that as long as the patient is off of aspirin and Plavix which can try Eliquis and see if the patient can tolerate it. Inform cardiology regarding this decision and  Stop the aspirin and Plavix 7/23 and start Eliquis 7/24 Discontinuing DVT prophylaxis as well. Patient was on Actonel for hypertension, currently transition to oral Lopressor 25 mg 3 times daily for rate control. Will see if tolerates Eliquis today, and if no hematuria can discharge to home.  3.  Urothelial cancer  Chronic kidney disease stage III Outpatient follow-up with urology.  4.  Type 2 diabetes mellitus. With chronic kidney disease stage III nephropathy. Continue sliding scale insulin. Holding Januvia. Holding metformin.   Also holding lisinopril for now.  5.  Insomnia and anxiety. Ambien at night  Diet: carb modified DVT Prophylaxis: subcutaneous Heparin  Advance goals of care discussion: full code  Family Communication: no family was present at bedside, at the time of interview.  Disposition:  Discharge to home likely later today or  tomorrow pending rate control,and if tolerating Eliquis  Consultants: cardiology, urology Procedures: Echocardiogram        Lab Results   Component Value Date   PLT 198 09/09/2017    Antibiotics  :  Rocephin 7/21=>  Anti-infectives (From admission, onward)   Start     Dose/Rate Route Frequency Ordered Stop   09/09/17 0800  cefTRIAXone (ROCEPHIN) 2 g in sodium chloride 0.9 % 100 mL IVPB     2 g 200 mL/hr over 30 Minutes Intravenous Daily 09/09/17 0257     09/07/17 1200  vancomycin (VANCOCIN) IVPB 1000 mg/200 mL premix  Status:  Discontinued     1,000 mg 200 mL/hr over 60 Minutes Intravenous Every 24 hours 09/06/17 2219 09/08/17 1340   09/06/17 2359  cefTRIAXone (ROCEPHIN) 1 g in sodium chloride 0.9 % 100 mL IVPB  Status:  Discontinued     1 g 200 mL/hr over 30 Minutes Intravenous Every 24 hours 09/06/17 2027 09/09/17 0257   09/06/17 1815  piperacillin-tazobactam (ZOSYN) IVPB 3.375 g     3.375 g 100 mL/hr over 30 Minutes Intravenous  Once 09/06/17 1806 09/06/17 1904   09/06/17 1815  vancomycin (VANCOCIN) IVPB 1000 mg/200 mL premix     1,000 mg 200 mL/hr over 60 Minutes Intravenous  Once 09/06/17 1806 09/06/17 2004        Objective:   Vitals:   09/08/17 0550 09/08/17 1414 09/08/17 2034 09/09/17 0622  BP: 119/76 (!) 140/96 (!) 139/92 116/84  Pulse: 82 95 95 96  Resp: 17 16 20 20   Temp: 98.6 F (37 C) 98.4 F (36.9 C) 100.1 F (37.8 C) 100 F (37.8 C)  TempSrc: Oral Oral Oral Oral  SpO2: 100% 100% 96% 98%  Weight:    87.5 kg (192 lb 14.4 oz)  Height:        Wt Readings from Last 3 Encounters:  09/09/17 87.5 kg (192 lb 14.4 oz)  09/05/16 83.1  kg (183 lb 1.6 oz)  07/24/16 86.5 kg (190 lb 12.8 oz)     Intake/Output Summary (Last 24 hours) at 09/09/2017 0744 Last data filed at 09/09/2017 0728 Gross per 24 hour  Intake 3140.33 ml  Output 1750 ml  Net 1390.33 ml     Physical Exam  Awake Alert, Oriented X 3, No new F.N deficits, Normal affect [place].AT,PERRAL Supple Neck,No JVD, No cervical lymphadenopathy appriciated.  Symmetrical Chest wall movement, Good air movement bilaterally, CTAB Irr, irr, s1,  s2,  +ve B.Sounds, Abd Soft, No tenderness, No organomegaly appriciated, No rebound - guarding or rigidity. No Cyanosis, Clubbing or edema, No new Rash or bruise      Data Review:    CBC Recent Labs  Lab 09/06/17 1834 09/07/17 0334 09/08/17 0833 09/09/17 0459  WBC 9.9 11.8* 6.7 7.1  HGB 11.7* 12.0* 11.4* 11.1*  HCT 34.9* 37.3* 35.1* 33.9*  PLT 185 203 173 198  MCV 90.4 91.0 90.5 90.2  MCH 30.3 29.3 29.4 29.5  MCHC 33.5 32.2 32.5 32.7  RDW 14.4 14.5 14.4 14.2  LYMPHSABS 0.6*  --  0.3* 0.5*  MONOABS 0.5  --  0.7 0.9  EOSABS 0.0  --  0.0 0.0  BASOSABS 0.0  --  0.0 0.0    Chemistries  Recent Labs  Lab 09/06/17 1834 09/07/17 0334 09/08/17 0833 09/09/17 0459  NA 131* 139 139 138  K 3.6 4.1 3.6 3.1*  CL 104 108 108 106  CO2 20* 24 23 24   GLUCOSE 224* 113* 109* 93  BUN 20 19 12 9   CREATININE 1.59* 1.47* 1.11 0.99  CALCIUM 7.7* 8.3* 8.4* 8.1*  MG  --   --   --  1.3*  AST 14* 17 16 17   ALT 8 10 12 14   ALKPHOS 37* 42 39 41  BILITOT 1.1 1.1 0.8 0.6   ------------------------------------------------------------------------------------------------------------------ No results for input(s): CHOL, HDL, LDLCALC, TRIG, CHOLHDL, LDLDIRECT in the last 72 hours.  Lab Results  Component Value Date   HGBA1C 6.0 (H) 03/31/2016   ------------------------------------------------------------------------------------------------------------------ No results for input(s): TSH, T4TOTAL, T3FREE, THYROIDAB in the last 72 hours.  Invalid input(s): FREET3 ------------------------------------------------------------------------------------------------------------------ No results for input(s): VITAMINB12, FOLATE, FERRITIN, TIBC, IRON, RETICCTPCT in the last 72 hours.  Coagulation profile No results for input(s): INR, PROTIME in the last 168 hours.  No results for input(s): DDIMER in the last 72 hours.  Cardiac Enzymes No results for input(s): CKMB, TROPONINI, MYOGLOBIN in the last  168 hours.  Invalid input(s): CK ------------------------------------------------------------------------------------------------------------------ No results found for: BNP  Inpatient Medications  Scheduled Meds: . apixaban  5 mg Oral BID  . Chlorhexidine Gluconate Cloth  6 each Topical Daily  . feeding supplement (PRO-STAT SUGAR FREE 64)  30 mL Oral BID  . insulin aspart  0-5 Units Subcutaneous QHS  . insulin aspart  0-9 Units Subcutaneous TID WC  . metoprolol tartrate  25 mg Oral TID  . mupirocin ointment  1 application Nasal BID  . zolpidem  5 mg Oral QHS   Continuous Infusions: . sodium chloride 100 mL/hr at 09/09/17 0728  . cefTRIAXone (ROCEPHIN)  IV     PRN Meds:.acetaminophen **OR** acetaminophen  Micro Results Recent Results (from the past 240 hour(s))  Urine culture     Status: Abnormal   Collection Time: 09/06/17  6:34 PM  Result Value Ref Range Status   Specimen Description   Final    URINE, CLEAN CATCH Performed at [facility], [place] [address] [place],  [place] [address]    Special Requests   Final    NONE Performed at [facility], [place] [address], [place], [place] [address]    Culture >=100,000 COLONIES/mL KLEBSIELLA PNEUMONIAE (A)  Final   Report Status 09/09/2017 FINAL  Final   Organism ID, Bacteria KLEBSIELLA PNEUMONIAE (A)  Final      Susceptibility   Klebsiella pneumoniae - MIC*    AMPICILLIN RESISTANT Resistant     CEFAZOLIN <=4 SENSITIVE Sensitive     CEFTRIAXONE <=1 SENSITIVE Sensitive     CIPROFLOXACIN <=0.25 SENSITIVE Sensitive     GENTAMICIN <=1 SENSITIVE Sensitive     IMIPENEM <=0.25 SENSITIVE Sensitive     NITROFURANTOIN 64 INTERMEDIATE Intermediate     TRIMETH/SULFA <=20 SENSITIVE Sensitive     AMPICILLIN/SULBACTAM 4 SENSITIVE Sensitive     PIP/TAZO 8 SENSITIVE Sensitive     Extended ESBL NEGATIVE Sensitive     * >=100,000 COLONIES/mL KLEBSIELLA PNEUMONIAE  Blood Culture (routine x 2)     Status:  None (Preliminary result)   Collection Time: 09/06/17  6:35 PM  Result Value Ref Range Status   Specimen Description   Final    BLOOD BLOOD LEFT FOREARM Performed at [facility] [address] [place], [place] [address]    Special Requests   Final    BOTTLES DRAWN AEROBIC AND ANAEROBIC Blood Culture adequate volume Performed at [facility] [address] [place], [place] [address]    Culture   Final    NO GROWTH 2 DAYS Performed at [facility] [address], [place], [place] [address]    Report Status PENDING  Incomplete  Blood Culture (routine x 2)     Status: None (Preliminary result)   Collection Time: 09/06/17  6:35 PM  Result Value Ref Range Status   Specimen Description   Final    BLOOD BLOOD RIGHT HAND Performed at [facility] [address] [place], [place] [address]    Special Requests   Final    BOTTLES DRAWN AEROBIC AND ANAEROBIC Blood Culture adequate volume Performed at [facility] [address], [place], [place] [address]    Culture  Setup Time   Final    ANAEROBIC BOTTLE ONLY GRAM NEGATIVE RODS Organism ID to follow CRITICAL RESULT CALLED TO, READ BACK BY AND VERIFIED WITH: B GREEN PHARMD 09/09/17 0249 JDW Performed at [facility], [address] [place], [place] [address]    Culture PENDING  Incomplete   Report Status PENDING  Incomplete  Blood Culture ID Panel (Reflexed)     Status: Abnormal   Collection Time: 09/06/17  6:35 PM  Result Value Ref Range Status   Enterococcus species NOT DETECTED NOT DETECTED Final   Listeria monocytogenes NOT DETECTED NOT DETECTED Final   Staphylococcus species NOT DETECTED NOT DETECTED Final   Staphylococcus aureus NOT DETECTED NOT DETECTED Final   Streptococcus species NOT DETECTED NOT DETECTED Final   Streptococcus agalactiae NOT DETECTED NOT DETECTED Final   Streptococcus pneumoniae NOT DETECTED NOT DETECTED Final    Streptococcus pyogenes NOT DETECTED NOT DETECTED Final   Acinetobacter baumannii NOT DETECTED NOT DETECTED Final   Enterobacteriaceae species DETECTED (A) NOT DETECTED Final    Comment: Enterobacteriaceae represent a large family of gram-negative bacteria, not a single organism. CRITICAL RESULT CALLED TO, READ BACK BY AND VERIFIED WITH: B GREEN PHARMD 09/09/17 0249 JDW    Enterobacter cloacae complex NOT DETECTED NOT DETECTED  Final   Escherichia coli NOT DETECTED NOT DETECTED Final   Klebsiella oxytoca NOT DETECTED NOT DETECTED Final   Klebsiella pneumoniae DETECTED (A) NOT DETECTED Final    Comment: CRITICAL RESULT CALLED TO, READ BACK BY AND VERIFIED WITH: B GREEN PHARMD 09/09/17 0249 JDW    Proteus species NOT DETECTED NOT DETECTED Final   Serratia marcescens NOT DETECTED NOT DETECTED Final   Carbapenem resistance NOT DETECTED NOT DETECTED Final   Haemophilus influenzae NOT DETECTED NOT DETECTED Final   Neisseria meningitidis NOT DETECTED NOT DETECTED Final   Pseudomonas aeruginosa NOT DETECTED NOT DETECTED Final   Candida albicans NOT DETECTED NOT DETECTED Final   Candida glabrata NOT DETECTED NOT DETECTED Final   Candida krusei NOT DETECTED NOT DETECTED Final   Candida parapsilosis NOT DETECTED NOT DETECTED Final   Candida tropicalis NOT DETECTED NOT DETECTED Final    Comment: Performed at [facility], [place] [address] [place], [place] [address]  MRSA PCR Screening     Status: Abnormal   Collection Time: 09/06/17  9:50 PM  Result Value Ref Range Status   MRSA by PCR POSITIVE (A) NEGATIVE Final    Comment:        The GeneXpert MRSA Assay (FDA approved for NASAL specimens only), is one component of a comprehensive MRSA colonization surveillance program. It is not intended to diagnose MRSA infection nor to guide or monitor treatment for MRSA infections. RESULT CALLED TO, READ BACK BY AND VERIFIED WITH: Heide Spark 09/07/17 0309 RHOLMES Performed at [facility], [place] [address] [place] [address], [place],  [address]     Radiology Reports Dg Chest 2 View  Result Date: 09/06/2017 CLINICAL DATA:  Cough and fever EXAM: CHEST - 2 VIEW COMPARISON:  08/24/2017 FINDINGS: The heart size and mediastinal contours are within normal limits. Both lungs are clear. The visualized skeletal structures are unremarkable. IMPRESSION: No active cardiopulmonary disease. Electronically Signed   By: Ulyses Jarred M.D.   On: 09/06/2017 19:09   Dg Chest 2 View  Result Date: 08/24/2017 CLINICAL DATA:  Bladder cancer 1 year follow-up with cough and congestion. EXAM: CHEST - 2 VIEW COMPARISON:  Chest CT 06/09/2016 FINDINGS: Normal heart size. Tortuous aorta without aneurysmal dilatation. Mild hyperinflation of the lungs without pulmonary consolidation, CHF, effusion or pneumothorax. No acute osseous abnormality. Dextroconvex curvature of the included lumbar spine. IMPRESSION: No active cardiopulmonary disease. Electronically Signed   By: Ashley Royalty M.D.   On: 08/24/2017 14:17    Time Spent in minutes  30   Jani Gravel M.D on 09/09/2017 at 7:44 AM  Between 7am to 7pm - Pager - [phone]   After 7pm go to [email or web address] - password [facility]  [facility] -  Office  [phone]

## 2017-09-09 NOTE — Care Management Important Message (Signed)
Important Message  Patient Details  Name: Ryan Peters MRN: [id number] Date of Birth: 08-27-1940   Medicare Important Message Given:  Yes    Kerin Salen 09/09/2017, 10:31 AMImportant Message  Patient Details  Name: Ryan Peters MRN: [id number] Date of Birth: [birth year]   Medicare Important Message Given:  Yes    Kerin Salen 09/09/2017, 10:31 AM

## 2017-09-10 ENCOUNTER — Telehealth: Payer: Self-pay | Admitting: Cardiology

## 2017-09-10 ENCOUNTER — Other Ambulatory Visit: Payer: Self-pay | Admitting: Pharmacist

## 2017-09-10 MED ORDER — APIXABAN 5 MG PO TABS: 5.0000 mg | ORAL_TABLET | Freq: Two times a day (BID) | ORAL | 1 refills | Status: DC

## 2017-09-10 NOTE — Telephone Encounter (Signed)
Rx sent electronically and 30-day free trial card sent to patient by mail.

## 2017-09-10 NOTE — Telephone Encounter (Signed)
Will send via email the 30 day free trial card [email or web address]

## 2017-09-10 NOTE — Telephone Encounter (Signed)
Appt changed to 09/16/17 at 9:30 for hosp f/u and card for eliquis left at front desk .Adonis Housekeeper

## 2017-09-10 NOTE — Telephone Encounter (Signed)
New Message     Patient was discharged on 09/09/2017 and was instructed to get a hospital f/u in a week. There was no availability in a week. I got patient scheduled for 10/02/2017. I also looked for the PA's, still no availability.   Patient calling the office for samples of medication:   1.  What medication and dosage are you requesting samples for? Eliquis 5 mg  2.  Are you currently out of this medication? Yes, patient was started on this in the hospital.   Patient wife is asking for the free 30 day trial offer.

## 2017-09-11 LAB — CULTURE, BLOOD (ROUTINE X 2)
CULTURE: NO GROWTH
Special Requests: ADEQUATE
Special Requests: ADEQUATE

## 2017-09-14 NOTE — Progress Notes (Signed)
Cardiology Office Note   Date:  09/16/2017   ID:  [name], DOB 1940-08-04, MRN [id number]  PCP:  Lenard Simmer, MD  Cardiologist:  [facility]  Chief Complaint  Patient presents with  . Hospitalization Follow-up    7/24 discharged, swelling in feet and ankles noted, and side and back pain, does have rash on upper leg of right leg     History of Present Illness: Ryan Peters is a 77 y.o. male who presents for post hospitalization follow up. He has a history of atrial fib, chronic RBBB, Type II Diabetes, Hypertension, mild systolic CHF. He was seen on consultation on 09/07/2017 in the setting of atrial fib with RVR with admission for UTI.     (copied for accuracy).  Echo was obtained by Dr. Percival Spanish 04/2016 which showed mildly reduced LVEF at 45-50% w/ diffuse hypokinesis. LA was mildly dilated. No significant valvular disease. Given CHA2DS2 VASc score > 2, Dr. Percival Spanish recommended Xarelto for a/c, but to start after bladder surgery. Pt had bladder surgery on 04/09/16 for resection. 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 He declined radical cystectomy and he elected to proceeded w/ definitive radiation therapy with weekly carboplatin.Pt noted that he did start Xarelto following bladder surgery but had significant hematuria, and discontinued use. His wife notes that his PCP subsequently placed him on ASA and Plavix and he has tolerated this ok w/o recurrent hematuria. He has been placed on Eliquis now and taken off of Plavix on discharge on 09/06/2017.  Since being home from the hospital, he has developed a pruritic rash in his groin that is becoming more prominent, he also has mouth and tongue tenderness, causing trouble with eating and swallowing. He has mild LEE noted in the right greater than the left which in new since being started on metoprolol with discontinuation of atenolol.   He states he has not gotten  his energy completely back from recent hospitalization. He denies bleeding on Eliquis. He was having hematuria when on Xarelto.   Past Medical History:  Diagnosis Date  . Arthritis   . Atrial fibrillation ([place])   . Bladder cancer ([place])   . Colon cancer ([place]) 1997  . Diabetes mellitus without complication ([place])   . Dysrhythmia    newly diagnosed with afib 03/2016. Dr Percival Spanish.  Marland Kitchen History of kidney stones   . Hypertension   . Scoliosis     Past Surgical History:  Procedure Laterality Date  . APPENDECTOMY    . CHOLECYSTECTOMY    . COLECTOMY  1997  . CYSTOSCOPY W/ URETERAL STENT PLACEMENT N/A 04/09/2016   Procedure: CYSTOSCOPY WITH RETROGRADE PYELOGRAM/;  Surgeon: Alexis Frock, MD;  Location: [facility];  Service: Urology;  Laterality: N/A;  . TONSILLECTOMY    . TRANSURETHRAL RESECTION OF BLADDER TUMOR N/A 04/09/2016   Procedure: TRANSURETHRAL RESECTION OF BLADDER TUMOR (TURBT);  Surgeon: Alexis Frock, MD;  Location: [facility];  Service: Urology;  Laterality: N/A;     Current Outpatient Medications  Medication Sig Dispense Refill  . Amino Acids-Protein Hydrolys (FEEDING SUPPLEMENT, PRO-STAT SUGAR FREE 64,) LIQD Take 30 mLs by mouth 2 (two) times daily. 900 mL 0  . apixaban (ELIQUIS) 5 MG TABS tablet Take 1 tablet (5 mg total) by mouth 2 (two) times daily. 60 tablet 1  . metoprolol tartrate (LOPRESSOR) 50 MG tablet Take 1.5 tablets (75 mg total) by mouth 2 (two) times daily. 90 tablet 0  . mupirocin ointment (BACTROBAN) 2 %  Place 1 application into the nose 2 (two) times daily. 22 g 0  . sitaGLIPtin-metformin (JANUMET) 50-1000 MG tablet Take 1 tablet by mouth daily.      No current facility-administered medications for this visit.     Allergies:   Patient has no known allergies.    Social History:  The patient  reports that he has been smoking cigarettes.  He has a 12.50 pack-year smoking history. He has never used smokeless tobacco. He reports that he does not drink alcohol or use  drugs.   Family History:  The patient's family history includes Diabetes in his mother; Heart attack (age of onset: 29) in his father; Hodgkin's lymphoma in his maternal uncle; Kidney cancer (age of onset: 39) in his brother; Throat cancer in his maternal uncle.    ROS: All other systems are reviewed and negative. Unless otherwise mentioned in H&P    PHYSICAL EXAM: VS:  BP 130/82 (BP Location: Left Arm, Patient Position: Sitting)   Pulse 91   Ht 5\' 9"  (1.753 m)   Wt 190 lb 12.8 oz (86.5 kg)   SpO2 98%   BMI 28.18 kg/m  , BMI Body mass index is 28.18 kg/m. GEN: Well nourished, well developed, in no acute distress  HEENT: normal Some erythema of the tongue and oral mucosa.  Neck: no JVD, carotid bruits, or masses Cardiac: IRRR; 7/6L systolic murmurs, rubs, or gallops,no edema  Respiratory:  clear to auscultation bilaterally, normal work of breathing GI: soft, nontender, nondistended, + BS MS: no deformity or atrophy Mild dependent edema of the legs bilaterally, R>L.  Skin: warm and dry, no rash. Bilateral groin rash.  Neuro:  Strength and sensation are intact Psych: euthymic mood, full affect   EKG: Not completed on this office visit.   Recent Labs: 09/09/2017: ALT 14; BUN 9; Creatinine, Ser 0.99; Hemoglobin 11.1; Magnesium 1.3; Platelets 198; Potassium 3.1; Sodium 138    Lipid Panel No results found for: CHOL, TRIG, HDL, CHOLHDL, VLDL, LDLCALC, LDLDIRECT    Wt Readings from Last 3 Encounters:  09/16/17 190 lb 12.8 oz (86.5 kg)  09/09/17 192 lb 14.4 oz (87.5 kg)  09/05/16 183 lb 1.6 oz (83.1 kg)      Other studies Reviewed: Echocardiogram August 31, 2017 Left ventricle: The cavity size was normal. Wall thickness was   normal. Systolic function was mildly reduced. The estimated   ejection fraction was in the range of 45% to 50%. Mild diffuse   hypokinesis with no identifiable regional variations. Acoustic   contrast opacification revealed no evidence ofthrombus. - Left  atrium: The atrium was mildly dilated. - Right ventricle: The cavity size was moderately dilated. Systolic   function was moderately reduced. - Right atrium: The atrium was severely dilated. - Pulmonary arteries: PA peak pressure: 36 mm Hg (S).  ASSESSMENT AND PLAN:  1. Atrial fib: Heart rate is controled today. He has had some edema in his LEE since starting metoprolol, which he did not have on atenolol. I will switch him back to atenolol 25 mg BID as this was better tolerated. Afib RVR was noted in the setting a significant UTI. He will continue Eliquis as he has not had any hematuria.while taking it compared to Xarelto.   2. Candida Infection of the groin: In the setting of recent admission with IV and oral antibiotics, along with diagnosis of diabetes, this is now caused candida growth as a secondary infection. I will give one po dose of diflucan 150 mg X 1 and provide  Nystatin/Triamcinalone ointment topically BID for symptomatic control.   3. Oral candida infection: Nystatin swish and swallow slurry is provided to assist with this. Hopefully improve eating and swallowing.   4. Neoplasm of the bladder: Followed by Dr. Tammi Klippel. S/p radiation. S/p severe UTI. Now recovering.     Current medicines are reviewed at length with the patient today.    Labs/ tests ordered today include: None   Phill Myron. West Pugh, ANP, AACC   09/16/2017 9:26 AM    [facility] [address], [place], [place] [address] Phone: [phone]; Fax: [phone]

## 2017-09-16 ENCOUNTER — Encounter: Payer: Self-pay | Admitting: Adult Health

## 2017-09-16 ENCOUNTER — Ambulatory Visit: Payer: Medicare Other | Admitting: Adult Health

## 2017-09-16 VITALS — BP 130/82 | HR 91 | Ht 69.0 in | Wt 190.8 lb

## 2017-09-16 DIAGNOSIS — I482 Chronic atrial fibrillation: Secondary | ICD-10-CM | POA: Diagnosis not present

## 2017-09-16 DIAGNOSIS — B370 Candidal stomatitis: Secondary | ICD-10-CM

## 2017-09-16 DIAGNOSIS — I358 Other nonrheumatic aortic valve disorders: Secondary | ICD-10-CM | POA: Diagnosis not present

## 2017-09-16 DIAGNOSIS — B3789 Other sites of candidiasis: Secondary | ICD-10-CM | POA: Diagnosis not present

## 2017-09-16 DIAGNOSIS — I4821 Permanent atrial fibrillation: Secondary | ICD-10-CM

## 2017-09-16 MED ORDER — ATENOLOL 25 MG PO TABS: 25.0000 mg | ORAL_TABLET | Freq: Two times a day (BID) | ORAL | 3 refills | Status: DC

## 2017-09-16 MED ORDER — NYSTATIN-TRIAMCINOLONE 100000-0.1 UNIT/GM-% EX OINT: 1.0000 "application " | TOPICAL_OINTMENT | Freq: Two times a day (BID) | CUTANEOUS | 0 refills | Status: DC

## 2017-09-16 MED ORDER — NYSTATIN 100000 UNIT/ML MT SUSP: 5.0000 mL | Freq: Four times a day (QID) | OROMUCOSAL | 0 refills | Status: DC

## 2017-09-16 MED ORDER — FLUCONAZOLE 150 MG PO TABS: 150.0000 mg | ORAL_TABLET | Freq: Every day | ORAL | 0 refills | Status: DC

## 2017-09-16 NOTE — Patient Instructions (Signed)
Medication Instructions:  STOP METOPROLOL  START ATENOLOL 25MG  TWICE DAILY  If you need a refill on your cardiac medications before your next appointment, please call your pharmacy.  Special Instructions: PLEASE FOLLOW DIRECTIONS FOR ANTIFUNGALS ON BOTTLE  Follow-Up: Your physician wants you to follow-up in: [place] ([place]), DNP,AACC IF PRIMARY CARDIOLOGIST IS UNAVAILABLE.   Thank you for choosing [facility] at [facility]!!

## 2017-10-02 ENCOUNTER — Ambulatory Visit: Payer: Medicare Other | Admitting: Cardiology

## 2017-10-25 NOTE — Progress Notes (Signed)
Cardiology Office Note   Date:  10/25/2017   ID:  Ryan Peters, DOB 1940/04/19, MRN [id number]  PCP:  Lenard Simmer, MD  Cardiologist:   Minus Breeding, MD    No chief complaint on file.     History of Present Illness: Ryan Peters is a 77 y.o. male who presents for evaluation of atrial fibrillation.   We saw him in July when he was in the hospital with atrial fib with RVR at the time of a UTI.  He initially had some hematuria on Xarelto.  This was at the time he was getting treatment for radiation and chemotherapy for bladder cancer.  He subsequently is tolerated Eliquis.  He denies any cardiovascular symptoms.  He does not notice his atrial fibrillation.  He can do a little push mowing.  He denies any cardiovascular symptoms.  I did review the hospital records for this appointment.  Past Medical History:  Diagnosis Date  . Arthritis   . Atrial fibrillation ([place])   . Bladder cancer ([place])   . Colon cancer ([place]) 1997  . Diabetes mellitus without complication ([place])   . Dysrhythmia    newly diagnosed with afib 03/2016. Dr Percival Spanish.  Marland Kitchen History of kidney stones   . Hypertension   . Scoliosis     Past Surgical History:  Procedure Laterality Date  . APPENDECTOMY    . CHOLECYSTECTOMY    . COLECTOMY  1997  . CYSTOSCOPY W/ URETERAL STENT PLACEMENT N/A 04/09/2016   Procedure: CYSTOSCOPY WITH RETROGRADE PYELOGRAM/;  Surgeon: Alexis Frock, MD;  [facility]: [facility];  Service: Urology;  Laterality: N/A;  . TONSILLECTOMY    . TRANSURETHRAL RESECTION OF BLADDER TUMOR N/A 04/09/2016   Procedure: TRANSURETHRAL RESECTION OF BLADDER TUMOR (TURBT);  Surgeon: Alexis Frock, MD;  [facility];  Service: Urology;  Laterality: N/A;     Current Outpatient Medications  Medication Sig Dispense Refill  . Amino Acids-Protein Hydrolys (FEEDING SUPPLEMENT, PRO-STAT SUGAR FREE 64,) LIQD Take 30 mLs by mouth 2 (two) times daily. 900 mL 0  . apixaban (ELIQUIS) 5 MG TABS tablet Take 1 tablet (5 mg  total) by mouth 2 (two) times daily. 60 tablet 1  . atenolol (TENORMIN) 25 MG tablet Take 1 tablet (25 mg total) by mouth 2 (two) times daily. 60 tablet 3  . fluconazole (DIFLUCAN) 150 MG tablet Take 1 tablet (150 mg total) by mouth daily. 1 tablet 0  . mupirocin ointment (BACTROBAN) 2 % Place 1 application into the nose 2 (two) times daily. 22 g 0  . nystatin (MYCOSTATIN) 100000 UNIT/ML suspension Take 5 mLs (500,000 Units total) by mouth 4 (four) times daily. 60 mL 0  . nystatin-triamcinolone ointment (MYCOLOG) Apply 1 application topically 2 (two) times daily. For 2 weeks 60 g 0  . sitaGLIPtin-metformin (JANUMET) 50-1000 MG tablet Take 1 tablet by mouth daily.      No current facility-administered medications for this visit.     Allergies:   Patient has no known allergies.    ROS:  Please see the history of present illness.   Otherwise, review of systems are positive none.   All other systems are reviewed and negative.    PHYSICAL EXAM: VS:  There were no vitals taken for this visit. , BMI There is no height or weight on file to calculate BMI. GENERAL:  Well appearing NECK:  No jugular venous distention, waveform within normal limits, carotid upstroke brisk and symmetric, no bruits, no thyromegaly LUNGS:  Clear to  auscultation bilaterally CHEST:  Unremarkable HEART:  PMI not displaced or sustained,S1 and S2 within normal limits, no S3, no clicks, no rubs, no murmurs, irregular ABD:  Flat, positive bowel sounds normal in frequency in pitch, no bruits, no rebound, no guarding, no midline pulsatile mass, no hepatomegaly, no splenomegaly EXT:  2 plus pulses throughout, no edema, no cyanosis no clubbing   EKG:  EKG is  ordered today. The ekg ordered today demonstrates atrial fibrillation, rate 84, right bundle branch block, no acute ST-T wave changes.   Recent Labs: 09/09/2017: ALT 14; BUN 9; Creatinine, Ser 0.99; Hemoglobin 11.1; Magnesium 1.3; Platelets 198; Potassium 3.1; Sodium 138      Lipid Panel No results found for: CHOL, TRIG, HDL, CHOLHDL, VLDL, LDLCALC, LDLDIRECT    Wt Readings from Last 3 Encounters:  09/16/17 190 lb 12.8 oz (86.5 kg)  09/09/17 192 lb 14.4 oz (87.5 kg)  09/05/16 183 lb 1.6 oz (83.1 kg)      Other studies Reviewed: Additional studies/ records that were reviewed today include: Hospital records Review of the above records demonstrates:    ASSESSMENT AND PLAN:  ATRIAL FIB :  The patient is atrial fibrillation of unknown onset.  He has no symptoms related to this. He will need to be on anticoagulation.  [name] has a CHA2DS2 - VASc score of 2.   He tolerates anticoagulation.  He is at reasonable rate control.  He is asymptomatic.  At this point no change in therapy.  He will continue with meds as listed.  Of note he says he did have follow-up labs although I do not have access to these.   .I will defer to [facility], Lourdes Sledge, MD.  He had very mild anemia when he was hospitalized.    RBBB:  This is chronic.  No change therapy.   HTN:       The blood pressure is at target. No change in medications is indicated. We will continue with therapeutic lifestyle changes (TLC).  TOBACCO:  We discussed the need to stop smoking again today.  He cut down but is not ready to quit.    Current medicines are reviewed at length with the patient today.  The patient does not have concerns regarding medicines.  The following changes have been made:  None    No orders of the defined types were placed in this encounter.    Disposition:   FU with me in one year.     Signed, Minus Breeding, MD  10/25/2017 9:36 PM    [facility]

## 2017-10-27 ENCOUNTER — Encounter: Payer: Self-pay | Admitting: Cardiology

## 2017-10-27 ENCOUNTER — Ambulatory Visit: Payer: Medicare Other | Admitting: Cardiology

## 2017-10-27 VITALS — BP 118/62 | HR 84 | Ht 69.0 in | Wt 187.0 lb

## 2017-10-27 DIAGNOSIS — I482 Chronic atrial fibrillation, unspecified: Secondary | ICD-10-CM | POA: Insufficient documentation

## 2017-10-27 DIAGNOSIS — I10 Essential (primary) hypertension: Secondary | ICD-10-CM | POA: Insufficient documentation

## 2017-10-27 DIAGNOSIS — Z720 Tobacco use: Secondary | ICD-10-CM

## 2017-10-27 NOTE — Patient Instructions (Signed)

## 2017-10-29 NOTE — Addendum Note (Signed)
Addended by: Zebedee Iba on: 10/29/2017 02:06 PM   Modules accepted: Orders

## 2017-12-01 ENCOUNTER — Other Ambulatory Visit: Payer: Self-pay | Admitting: Cardiology

## 2018-01-21 ENCOUNTER — Telehealth: Payer: Self-pay | Admitting: Cardiology

## 2018-01-21 NOTE — Telephone Encounter (Signed)
Pt's wife calling requesting samples of Eliquis, because pt is in the donut hole. Please address

## 2018-01-21 NOTE — Telephone Encounter (Signed)
Returned call to pt wife. Adv pt wife that samples of Eliquis will be left at the front desk for pick up. Pt wife voiced appreciation for the assistance.  Medication Samples have been provided to the patient.  Drug name: Eliquis      Strength: 5mg           Qty: 2 boxes    LOT: [id number]  Exp.Date: Nov/21  Dosing instructions: 1 tablet twice daily

## 2018-03-02 ENCOUNTER — Other Ambulatory Visit (HOSPITAL_COMMUNITY): Payer: Self-pay | Admitting: Urology

## 2018-03-02 ENCOUNTER — Ambulatory Visit (HOSPITAL_COMMUNITY)
Admission: RE | Admit: 2018-03-02 | Discharge: 2018-03-02 | Disposition: A | Discharge status: Home / Self Care | Payer: Medicare Other | Source: Ambulatory Visit | Attending: Urology | Admitting: Urology

## 2018-03-02 DIAGNOSIS — C672 Malignant neoplasm of lateral wall of bladder: Secondary | ICD-10-CM

## 2018-03-19 ENCOUNTER — Other Ambulatory Visit: Payer: Self-pay | Admitting: Adult Health

## 2018-07-09 ENCOUNTER — Other Ambulatory Visit: Payer: Self-pay | Admitting: Cardiology

## 2018-08-04 ENCOUNTER — Telehealth: Payer: Self-pay | Admitting: *Deleted

## 2018-08-04 NOTE — Telephone Encounter (Signed)
A message was left, re: follow up visit. 

## 2018-11-03 NOTE — Progress Notes (Signed)
Cardiology Office Note   Date:  11/04/2018   ID:  Ryan Peters, DOB 04-Oct-1940, MRN [id number]  PCP:  Lenard Simmer, MD  Cardiologist:   Minus Breeding, MD    Chief Complaint  Patient presents with  . Atrial Fibrillation      History of Present Illness: Ryan Peters is a 78 y.o. male who presents for evaluation of atrial fibrillation.   We saw him in July 2019 when he was in the hospital with atrial fib with RVR at the time of a UTI.  He initially had some hematuria on Xarelto.  This was at the time he was getting treatment for radiation and chemotherapy for bladder cancer.  He subsequently has tolerated Eliquis.    Since I last saw him has done well.  The patient denies any new symptoms such as chest discomfort, neck or arm discomfort. There has been no new shortness of breath, PND or orthopnea. There have been no reported palpitations, presyncope or syncope.  He does yard work and does not report any limitations with this.   Past Medical History:  Diagnosis Date  . Arthritis   . Atrial fibrillation ([place])   . Bladder cancer ([place])   . Colon cancer ([place]) 1997  . Diabetes mellitus without complication ([place])   . Dysrhythmia    newly diagnosed with afib 03/2016. Dr Percival Spanish.  Marland Kitchen History of kidney stones   . Hypertension   . Scoliosis     Past Surgical History:  Procedure Laterality Date  . APPENDECTOMY    . CHOLECYSTECTOMY    . COLECTOMY  1997  . CYSTOSCOPY W/ URETERAL STENT PLACEMENT N/A 04/09/2016   Procedure: CYSTOSCOPY WITH RETROGRADE PYELOGRAM/;  Surgeon: Alexis Frock, MD;  [facility];  Service: Urology;  Laterality: N/A;  . TONSILLECTOMY    . TRANSURETHRAL RESECTION OF BLADDER TUMOR N/A 04/09/2016   Procedure: TRANSURETHRAL RESECTION OF BLADDER TUMOR (TURBT);  Surgeon: Alexis Frock, MD;  [facility];  Service: Urology;  Laterality: N/A;     Current Outpatient Medications  Medication Sig Dispense Refill  . apixaban (ELIQUIS) 5 MG TABS tablet Take 1  tablet (5 mg total) by mouth 2 (two) times daily. 180 tablet 3  . atenolol (TENORMIN) 25 MG tablet TAKE 1 TABLET BY MOUTH TWICE A DAY 180 tablet 2  . sitaGLIPtin-metformin (JANUMET) 50-1000 MG tablet Take 1 tablet by mouth daily.      No current facility-administered medications for this visit.     Allergies:   Patient has no known allergies.    ROS:  Please see the history of present illness.   Otherwise, review of systems are positive none.   All other systems are reviewed and negative.    PHYSICAL EXAM: VS:  BP 125/78   Pulse 77   Ht 5\' 8"  (1.727 m)   Wt 184 lb (83.5 kg)   BMI 27.98 kg/m  , BMI Body mass index is 27.98 kg/m. GENERAL:  Well appearing NECK:  No jugular venous distention, waveform within normal limits, carotid upstroke brisk and symmetric, no bruits, no thyromegaly LUNGS:  Clear to auscultation bilaterally CHEST:  Unremarkable HEART:  PMI not displaced or sustained,S1 and S2 within normal limits, no S3, no clicks, no rubs, no murmurs, irregular ABD:  Flat, positive bowel sounds normal in frequency in pitch, no bruits, no rebound, no guarding, no midline pulsatile mass, no hepatomegaly, no splenomegaly EXT:  2 plus pulses throughout, no edema, no cyanosis no clubbing   EKG:  EKG is  ordered today. The ekg ordered today demonstrates atrial fibrillation, rate 79, right bundle branch block, no acute ST-T wave changes.   Recent Labs: No results found for requested labs within last 8760 hours.    Lipid Panel No results found for: CHOL, TRIG, HDL, CHOLHDL, VLDL, LDLCALC, LDLDIRECT    Wt Readings from Last 3 Encounters:  11/04/18 184 lb (83.5 kg)  10/27/17 187 lb (84.8 kg)  09/16/17 190 lb 12.8 oz (86.5 kg)      Other studies Reviewed: Additional studies/ records that were reviewed today include: None Review of the above records demonstrates:  NA   ASSESSMENT AND PLAN:  ATRIAL FIB :  The patient is atrial fibrillation of unknown onset.  He has no  symptoms related to this. He will need to be on anticoagulation.  [name] has a CHA2DS2 - VASc score of 2.   He tolerates anticoagulation.  He has good rate control.  No change in therapy.  RBBB: This is chronic.  No change in therapy.  HTN:       The blood pressure is well controlled.  He will continue the meds as listed.   TOBACCO: He is cut way back on his cigarettes.  He knows I want him to quit completely.   Current medicines are reviewed at length with the patient today.  The patient does not have concerns regarding medicines.  The following changes have been made:  None    Orders Placed This Encounter  Procedures  . EKG 12-Lead     Disposition:   FU with me in 12 months .     Signed, Minus Breeding, MD  11/04/2018 3:12 PM    [place]

## 2018-11-04 ENCOUNTER — Ambulatory Visit (INDEPENDENT_AMBULATORY_CARE_PROVIDER_SITE_OTHER): Payer: Medicare Other | Admitting: Cardiology

## 2018-11-04 ENCOUNTER — Encounter: Payer: Self-pay | Admitting: Cardiology

## 2018-11-04 ENCOUNTER — Other Ambulatory Visit: Payer: Self-pay

## 2018-11-04 VITALS — BP 125/78 | HR 77 | Ht 68.0 in | Wt 184.0 lb

## 2018-11-04 DIAGNOSIS — I10 Essential (primary) hypertension: Secondary | ICD-10-CM

## 2018-11-04 DIAGNOSIS — I482 Chronic atrial fibrillation, unspecified: Secondary | ICD-10-CM | POA: Diagnosis not present

## 2018-11-04 DIAGNOSIS — Z720 Tobacco use: Secondary | ICD-10-CM | POA: Diagnosis not present

## 2018-11-04 MED ORDER — APIXABAN 5 MG PO TABS: 5.0000 mg | ORAL_TABLET | Freq: Two times a day (BID) | ORAL | 3 refills | Status: DC

## 2018-11-04 NOTE — Patient Instructions (Signed)
Medication Instructions:  Your physician recommends that you continue on your current medications as directed. Please refer to the Current Medication list given to you today.  If you need a refill on your cardiac medications before your next appointment, please call your pharmacy.   Lab work: NONE  Testing/Procedures: NONE  Follow-Up: At [facility], you and your health needs are our priority.  As part of our continuing mission to provide you with exceptional heart care, we have created designated Provider Care Teams.  These Care Teams include your primary Cardiologist (physician) and Advanced Practice Providers (APPs -  Physician Assistants and Nurse Practitioners) who all work together to provide you with the care you need, when you need it. You will need a follow up appointment in 12 months.  Please call our office 2 months in advance to schedule this appointment.  You may see Geovanie Hochrein, MD or one of the following Advanced Practice Providers on your designated Care Team:   Rhonda Barrett, PA-C Kathryn Lawrence, DNP, ANP       

## 2018-11-15 ENCOUNTER — Other Ambulatory Visit: Payer: Self-pay

## 2018-11-15 ENCOUNTER — Other Ambulatory Visit (HOSPITAL_COMMUNITY): Payer: Self-pay | Admitting: Urology

## 2018-11-15 ENCOUNTER — Ambulatory Visit (HOSPITAL_COMMUNITY)
Admission: RE | Admit: 2018-11-15 | Discharge: 2018-11-15 | Disposition: A | Discharge status: Home / Self Care | Payer: Medicare Other | Source: Ambulatory Visit | Attending: Urology | Admitting: Urology

## 2018-11-15 DIAGNOSIS — C672 Malignant neoplasm of lateral wall of bladder: Secondary | ICD-10-CM | POA: Diagnosis present

## 2019-10-10 ENCOUNTER — Other Ambulatory Visit: Payer: Self-pay | Admitting: Cardiology

## 2019-11-02 DIAGNOSIS — Z7189 Other specified counseling: Secondary | ICD-10-CM | POA: Insufficient documentation

## 2019-11-02 NOTE — Progress Notes (Signed)
Cardiology Office Note   Date:  11/03/2019   ID:  Ryan Peters, DOB 1940/10/03, MRN [id number]  PCP:  Lenard Simmer, MD  Cardiologist:   Minus Breeding, MD    Chief Complaint  Patient presents with  . Atrial Fibrillation      History of Present Illness: Ryan Peters is a 79 y.o. male who presents for evaluation of atrial fibrillation.   Since I last saw him he has done well.  The patient denies any new symptoms such as chest discomfort, neck or arm discomfort. There has been no new shortness of breath, PND or orthopnea. There have been no reported palpitations, presyncope or syncope.  He is mostly limited by back pain.  Past Medical History:  Diagnosis Date  . Arthritis   . Atrial fibrillation ([place])   . Bladder cancer ([place])   . Colon cancer ([place]) 1997  . Diabetes mellitus without complication ([place])   . History of kidney stones   . Hypertension   . Scoliosis     Past Surgical History:  Procedure Laterality Date  . APPENDECTOMY    . CHOLECYSTECTOMY    . COLECTOMY  1997  . CYSTOSCOPY W/ URETERAL STENT PLACEMENT N/A 04/09/2016   Procedure: CYSTOSCOPY WITH RETROGRADE PYELOGRAM/;  Surgeon: Alexis Frock, MD;  Location: [facility];  Service: Urology;  Laterality: N/A;  . TONSILLECTOMY    . TRANSURETHRAL RESECTION OF BLADDER TUMOR N/A 04/09/2016   Procedure: TRANSURETHRAL RESECTION OF BLADDER TUMOR (TURBT);  Surgeon: Alexis Frock, MD;  Location: [facility];  Service: Urology;  Laterality: N/A;     Current Outpatient Medications  Medication Sig Dispense Refill  . atenolol (TENORMIN) 25 MG tablet TAKE 1 TABLET BY MOUTH TWICE A DAY 180 tablet 2  . ELIQUIS 5 MG TABS tablet TAKE 1 TABLET BY MOUTH  TWICE DAILY 180 tablet 3  . sitaGLIPtin-metformin (JANUMET) 50-1000 MG tablet Take 1 tablet by mouth daily.      No current facility-administered medications for this visit.    Allergies:   Patient has no known allergies.    ROS:  Please see the history of present illness.    Otherwise, review of systems are positive none.   All other systems are reviewed and negative.    PHYSICAL EXAM: VS:  BP 115/76   Pulse 82   Temp (!) 95.2 F (35.1 C)   Ht 5\' 8"  (1.727 m)   Wt 195 lb 6.4 oz (88.6 kg)   SpO2 98%   BMI 29.71 kg/m  , BMI Body mass index is 29.71 kg/m. GENERAL:  Well appearing NECK:  No jugular venous distention, waveform within normal limits, carotid upstroke brisk and symmetric, no bruits, no thyromegaly LUNGS:  Clear to auscultation bilaterally CHEST:  Unremarkable HEART:  PMI not displaced or sustained,S1 and S2 within normal limits, no S3,  no clicks, no rubs, no murmurs, irregular ABD:  Flat, positive bowel sounds normal in frequency in pitch, no bruits, no rebound, no guarding, no midline pulsatile mass, no hepatomegaly, no splenomegaly, ventral hernia EXT:  2 plus pulses throughout, no edema, no cyanosis no clubbing  EKG:  EKG is  ordered today. The ekg ordered today demonstrates atrial fibrillation, rate 82, right bundle branch block, no acute ST-T wave changes.   Recent Labs: No results found for requested labs within last 8760 hours.    Lipid Panel No results found for: CHOL, TRIG, HDL, CHOLHDL, VLDL, LDLCALC, LDLDIRECT    Wt Readings from Last 3 Encounters:  11/03/19  195 lb 6.4 oz (88.6 kg)  11/04/18 184 lb (83.5 kg)  10/27/17 187 lb (84.8 kg)      Other studies Reviewed: Additional studies/ records that were reviewed today include: None Review of the above records demonstrates:  No   ASSESSMENT AND PLAN:  ATRIAL FIB :  The patient is atrial fibrillation of unknown onset.  He has no symptoms related to this. He will need to be on anticoagulation.  Mr. Mildred Bollard has a CHA2DS2 - VASc score of 2.   No change in therapy.  RBBB:   This is chronic.  No change in therapy.  He does not have any symptoms related to bradycardia arrhythmia and I talked to him about this.  He will let me know if he has any lightheadedness, presyncope or  syncope going forward.   HTN:       The blood pressure is well controlled.  No change in therapy.   TOBACCO:   Told him twice to quit smoking.   COVID EDUCATION:   He has been vaccinated.   Current medicines are reviewed at length with the patient today.  The patient does not have concerns regarding medicines.  The following changes have been made:  None    Orders Placed This Encounter  Procedures  . EKG 12-Lead     Disposition:   FU with me in 12 months .     Signed, Minus Breeding, MD  11/03/2019 4:51 PM    [facility]

## 2019-11-03 ENCOUNTER — Other Ambulatory Visit: Payer: Self-pay

## 2019-11-03 ENCOUNTER — Ambulatory Visit: Payer: Medicare Other | Admitting: Cardiology

## 2019-11-03 ENCOUNTER — Encounter: Payer: Self-pay | Admitting: Cardiology

## 2019-11-03 VITALS — BP 115/76 | HR 82 | Temp 95.2°F | Ht 68.0 in | Wt 195.4 lb

## 2019-11-03 DIAGNOSIS — I482 Chronic atrial fibrillation, unspecified: Secondary | ICD-10-CM | POA: Diagnosis not present

## 2019-11-03 DIAGNOSIS — Z720 Tobacco use: Secondary | ICD-10-CM | POA: Diagnosis not present

## 2019-11-03 DIAGNOSIS — Z7189 Other specified counseling: Secondary | ICD-10-CM

## 2019-11-03 DIAGNOSIS — I10 Essential (primary) hypertension: Secondary | ICD-10-CM | POA: Diagnosis not present

## 2019-11-03 NOTE — Patient Instructions (Signed)
Medication Instructions:  Your physician recommends that you continue on your current medications as directed. Please refer to the Current Medication list given to you today.  *If you need a refill on your cardiac medications before your next appointment, please call your pharmacy*   Follow-Up: At [facility], you and your health needs are our priority.  As part of our continuing mission to provide you with exceptional heart care, we have created designated Provider Care Teams.  These Care Teams include your primary Cardiologist (physician) and Advanced Practice Providers (APPs -  Physician Assistants and Nurse Practitioners) who all work together to provide you with the care you need, when you need it.  We recommend signing up for the patient portal called "MyChart".  Sign up information is provided on this After Visit Summary.  MyChart is used to connect with patients for Virtual Visits (Telemedicine).  Patients are able to view lab/test results, encounter notes, upcoming appointments, etc.  Non-urgent messages can be sent to your provider as well.   To learn more about what you can do with MyChart, go to [email or web address].    Your next appointment:   12 month(s)  The format for your next appointment:   In Person  Provider:   Emori Hochrein, MD 

## 2019-11-04 ENCOUNTER — Ambulatory Visit: Payer: Medicare Other | Admitting: Cardiology

## 2019-12-21 ENCOUNTER — Other Ambulatory Visit: Payer: Self-pay

## 2019-12-21 ENCOUNTER — Other Ambulatory Visit (HOSPITAL_COMMUNITY): Payer: Self-pay | Admitting: Urology

## 2019-12-21 ENCOUNTER — Ambulatory Visit (HOSPITAL_COMMUNITY)
Admission: RE | Admit: 2019-12-21 | Discharge: 2019-12-21 | Disposition: A | Discharge status: Home / Self Care | Payer: Medicare Other | Source: Ambulatory Visit | Attending: Urology | Admitting: Urology

## 2019-12-21 DIAGNOSIS — C672 Malignant neoplasm of lateral wall of bladder: Secondary | ICD-10-CM | POA: Diagnosis present

## 2020-11-04 NOTE — Progress Notes (Signed)
Cardiology Office Note   Date:  11/05/2020   ID:  Ryan Peters, DOB December 09, 1940, MRN [id number]  PCP:  Lenard Simmer, MD  Cardiologist:   Minus Breeding, MD    No chief complaint on file.     History of Present Illness: Ryan Peters is a 80 y.o. male who presents for evaluation of atrial fibrillation.   Since I last saw him he has had iron deficiency anemia.  His iron was down to 12.  His hemoglobin around 8.  He has had iron orally since then.  I do not have the most recent blood work but he says it is improved.  He was taken off Eliquis.  He has had progressive renal insufficiency and is taken off his Janumet.  He sees a urologist but I do not see that he sees a nephrologist.  He does not feel his atrial fibrillation.  He does not have any palpitations, presyncope or syncope.  He has had no chest pressure, neck or arm discomfort.  He said no weight gain or edema.  He gets around very slowly because of severe back problems.  He has had bladder cancer and has survived this and is followed as mentioned by urology.   Past Medical History:  Diagnosis Date   Arthritis    Atrial fibrillation [facility])    Bladder cancer ([place])    Colon cancer ([place]) 1997   Diabetes mellitus without complication ([place])    History of kidney stones    Hypertension    Scoliosis     Past Surgical History:  Procedure Laterality Date   APPENDECTOMY     CHOLECYSTECTOMY     COLECTOMY  1997   CYSTOSCOPY W/ URETERAL STENT PLACEMENT N/A 04/09/2016   Procedure: CYSTOSCOPY WITH RETROGRADE PYELOGRAM/;  Surgeon: Alexis Frock, MD;  [facility];  Service: Urology;  Laterality: N/A;   TONSILLECTOMY     TRANSURETHRAL RESECTION OF BLADDER TUMOR N/A 04/09/2016   Procedure: TRANSURETHRAL RESECTION OF BLADDER TUMOR (TURBT);  Surgeon: Alexis Frock, MD;  [facility];  Service: Urology;  Laterality: N/A;     Current Outpatient Medications  Medication Sig Dispense Refill   ferrous sulfate 325 (65 FE) MG  tablet Take 325 mg by mouth 2 (two) times daily.     atenolol (TENORMIN) 25 MG tablet TAKE 1 TABLET BY MOUTH TWICE A DAY 180 tablet 2   ELIQUIS 5 MG TABS tablet TAKE 1 TABLET BY MOUTH  TWICE DAILY (Patient not taking: Reported on 11/05/2020) 180 tablet 3   sitaGLIPtin-metformin (JANUMET) 50-1000 MG tablet Take 1 tablet by mouth daily.  (Patient not taking: Reported on 11/05/2020)     No current facility-administered medications for this visit.    Allergies:   Patient has no known allergies.    ROS:  Please see the history of present illness.   Otherwise, review of systems are positive back pain and joint pain.   All other systems are reviewed and negative.    PHYSICAL EXAM: VS:  BP 137/78   Pulse 85   Ht '5\' 8"'$  (1.727 m)   Wt 199 lb (90.3 kg)   SpO2 98%   BMI 30.26 kg/m  , BMI Body mass index is 30.26 kg/m. GENERAL:  Well appearing NECK:  No jugular venous distention, waveform within normal limits, carotid upstroke brisk and symmetric, no bruits, no thyromegaly LUNGS:  Clear to auscultation bilaterally CHEST:  Unremarkable HEART:  PMI not displaced or sustained,S1 and S2 within normal limits, no  S3, no clicks, no rubs, no murmurs, irregular  ABD:  Flat, positive bowel sounds normal in frequency in pitch, no bruits, no rebound, no guarding, no midline pulsatile mass, no hepatomegaly, no splenomegaly EXT:  2 plus pulses upper pulses and decreased dorsalis pedis and posterior tibialis bilaterally, mild ankle edema, no cyanosis no clubbing    EKG:  EKG is  ordered today. The ekg ordered today demonstrates atrial fibrillation, rate 85, right bundle branch block, no acute ST-T wave changes.   Recent Labs: No results found for requested labs within last 8760 hours.    Lipid Panel No results found for: CHOL, TRIG, HDL, CHOLHDL, VLDL, LDLCALC, LDLDIRECT    Wt Readings from Last 3 Encounters:  11/05/20 199 lb (90.3 kg)  11/03/19 195 lb 6.4 oz (88.6 kg)  11/04/18 184 lb (83.5 kg)       Other studies Reviewed: Additional studies/ records that were reviewed today include: Labs from primary office.  Review of the above records demonstrates: See elsewhere   ASSESSMENT AND PLAN:  ATRIAL FIB :  The patient has atrial fibrillation and currently cannot be on anticoagulation because of iron deficiency anemia.  I am trying to clarify with his primary provider whether he thinks he will ever be a candidate to resume anticoagulation.  Mr. Ryan Peters has a CHA2DS2 - VASc score of 3.  If he is deemed to be a candidate I would start warfarin given his renal insufficiency.  If not he could be considered for Watchman.  We had a long discussion about all of this.  His understanding seems to be somewhat limited.  I think his wife understood the conversation.  I will try to communicate with his primary provider.  RBBB:   This is chronic.   HTN:       The blood pressure is well controlled.   TOBACCO:   We have talked about stopping smoking in the past.   Current medicines are reviewed at length with the patient today.  The patient does not have concerns regarding medicines.  The following changes have been made:  None    Orders Placed This Encounter  Procedures   EKG 12-Lead      Disposition:   FU with APP in 3 months.   Signed, Minus Breeding, MD  11/05/2020 4:34 PM    [facility]

## 2020-11-05 ENCOUNTER — Encounter: Payer: Self-pay | Admitting: Cardiology

## 2020-11-05 ENCOUNTER — Other Ambulatory Visit: Payer: Self-pay

## 2020-11-05 ENCOUNTER — Ambulatory Visit (INDEPENDENT_AMBULATORY_CARE_PROVIDER_SITE_OTHER): Payer: Medicare Other | Admitting: Cardiology

## 2020-11-05 VITALS — BP 137/78 | HR 85 | Ht 68.0 in | Wt 199.0 lb

## 2020-11-05 DIAGNOSIS — I10 Essential (primary) hypertension: Secondary | ICD-10-CM

## 2020-11-05 DIAGNOSIS — I482 Chronic atrial fibrillation, unspecified: Secondary | ICD-10-CM

## 2020-11-05 DIAGNOSIS — Z720 Tobacco use: Secondary | ICD-10-CM

## 2020-11-05 NOTE — Patient Instructions (Addendum)
Medication Instructions:  Your Physician recommend you continue on your current medication as directed.    *If you need a refill on your cardiac medications before your next appointment, please call your pharmacy*   Lab Work: None ordered today   Testing/Procedures: None ordered today   Follow-Up: At [facility], you and your health needs are our priority.  As part of our continuing mission to provide you with exceptional heart care, we have created designated Provider Care Teams.  These Care Teams include your primary Cardiologist (physician) and Advanced Practice Providers (APPs -  Physician Assistants and Nurse Practitioners) who all work together to provide you with the care you need, when you need it.  We recommend signing up for the patient portal called "MyChart".  Sign up information is provided on this After Visit Summary.  MyChart is used to connect with patients for Virtual Visits (Telemedicine).  Patients are able to view lab/test results, encounter notes, upcoming appointments, etc.  Non-urgent messages can be sent to your provider as well.   To learn more about what you can do with MyChart, go to [email or web address].    Your next appointment:   3 month(s)  The format for your next appointment:   In Person  Provider:   APP

## 2020-12-17 ENCOUNTER — Other Ambulatory Visit (HOSPITAL_COMMUNITY): Payer: Self-pay | Admitting: Urology

## 2020-12-17 ENCOUNTER — Other Ambulatory Visit: Payer: Self-pay

## 2020-12-17 ENCOUNTER — Ambulatory Visit (HOSPITAL_COMMUNITY)
Admission: RE | Admit: 2020-12-17 | Discharge: 2020-12-17 | Disposition: A | Discharge status: Home / Self Care | Payer: Medicare Other | Source: Ambulatory Visit | Attending: Urology | Admitting: Urology

## 2020-12-17 DIAGNOSIS — C672 Malignant neoplasm of lateral wall of bladder: Secondary | ICD-10-CM | POA: Diagnosis not present

## 2020-12-17 IMAGING — DX DG CHEST 2V
2 series · 2 of 2 positions shown · non-contrast
Comparison: 11/15/2018

CLINICAL DATA: History of bladder carcinoma

EXAM:
CHEST - 2 VIEW

[chest pa]
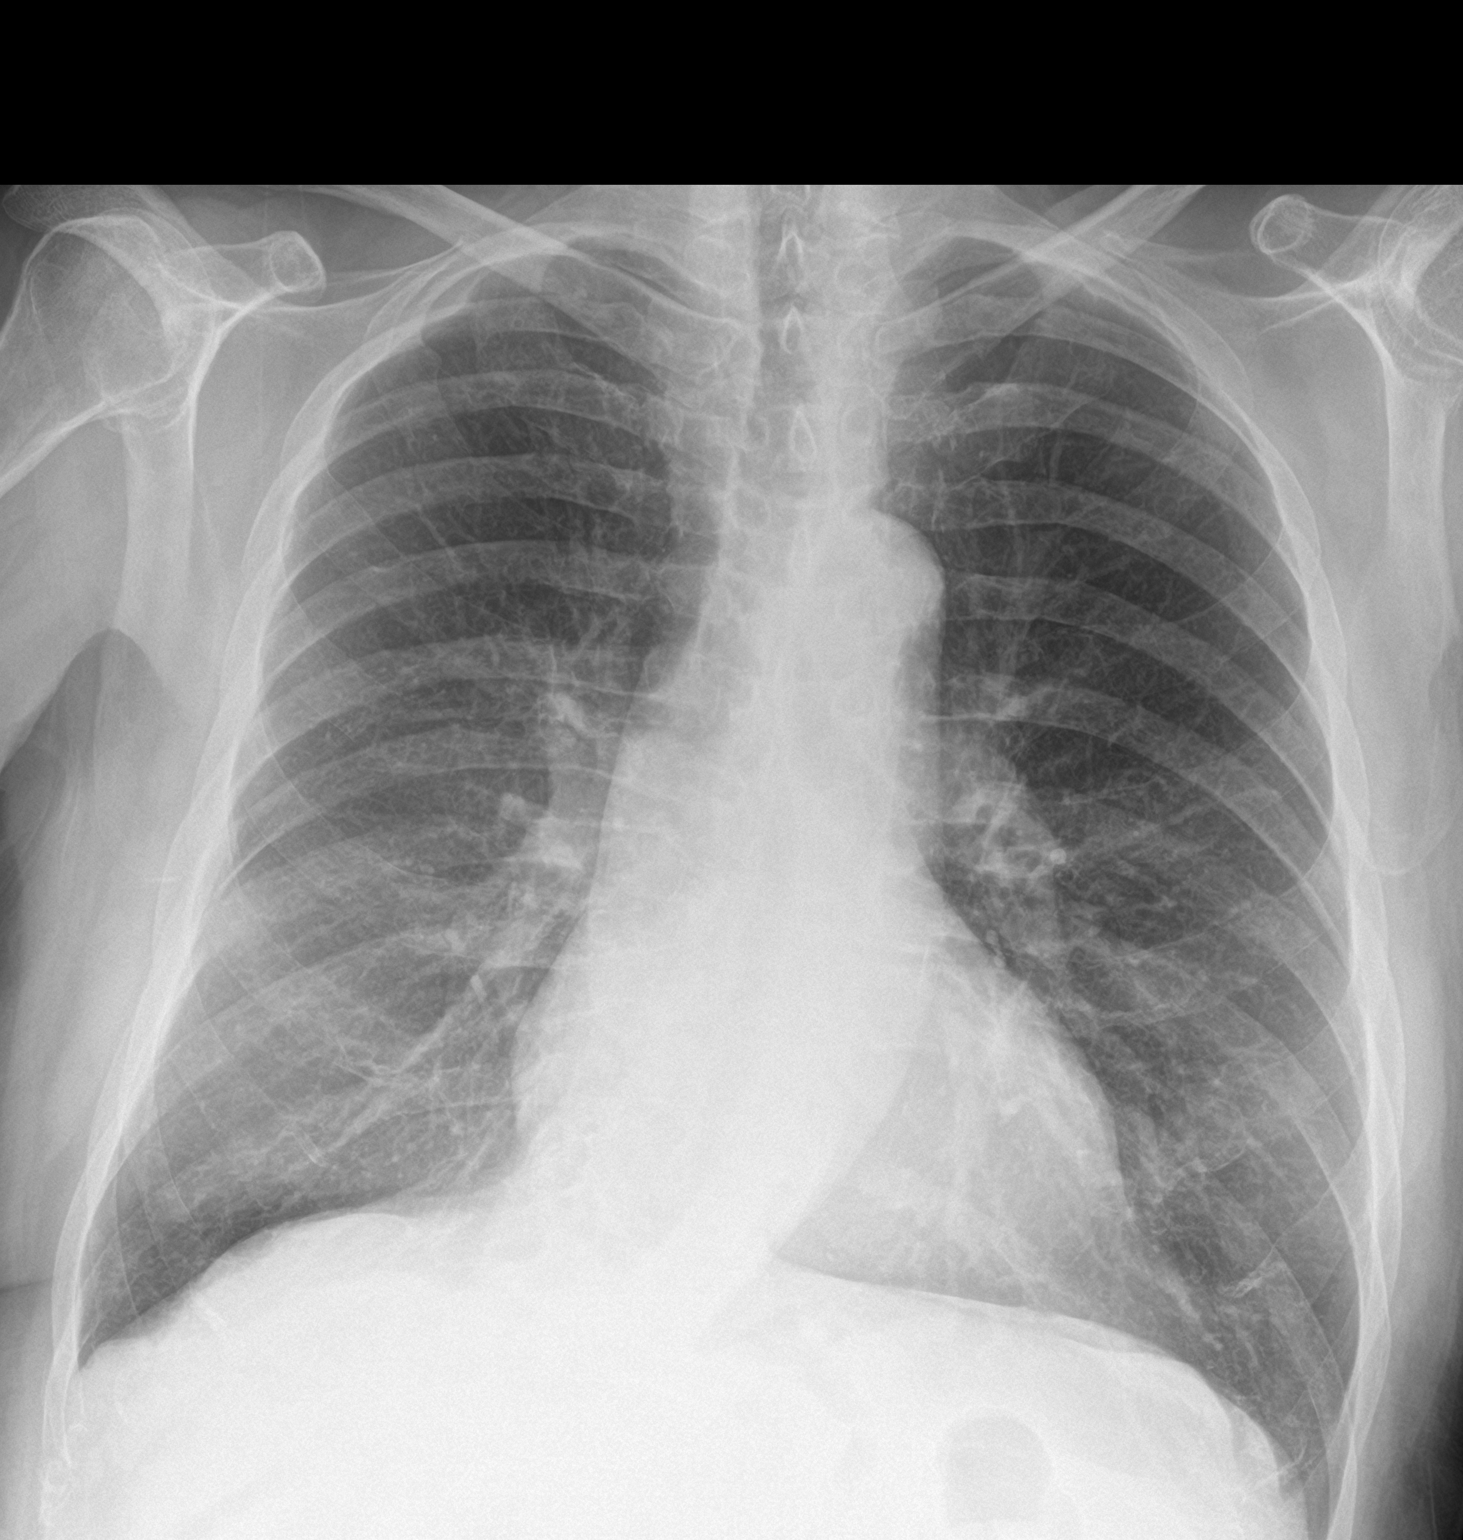

[chest lat]
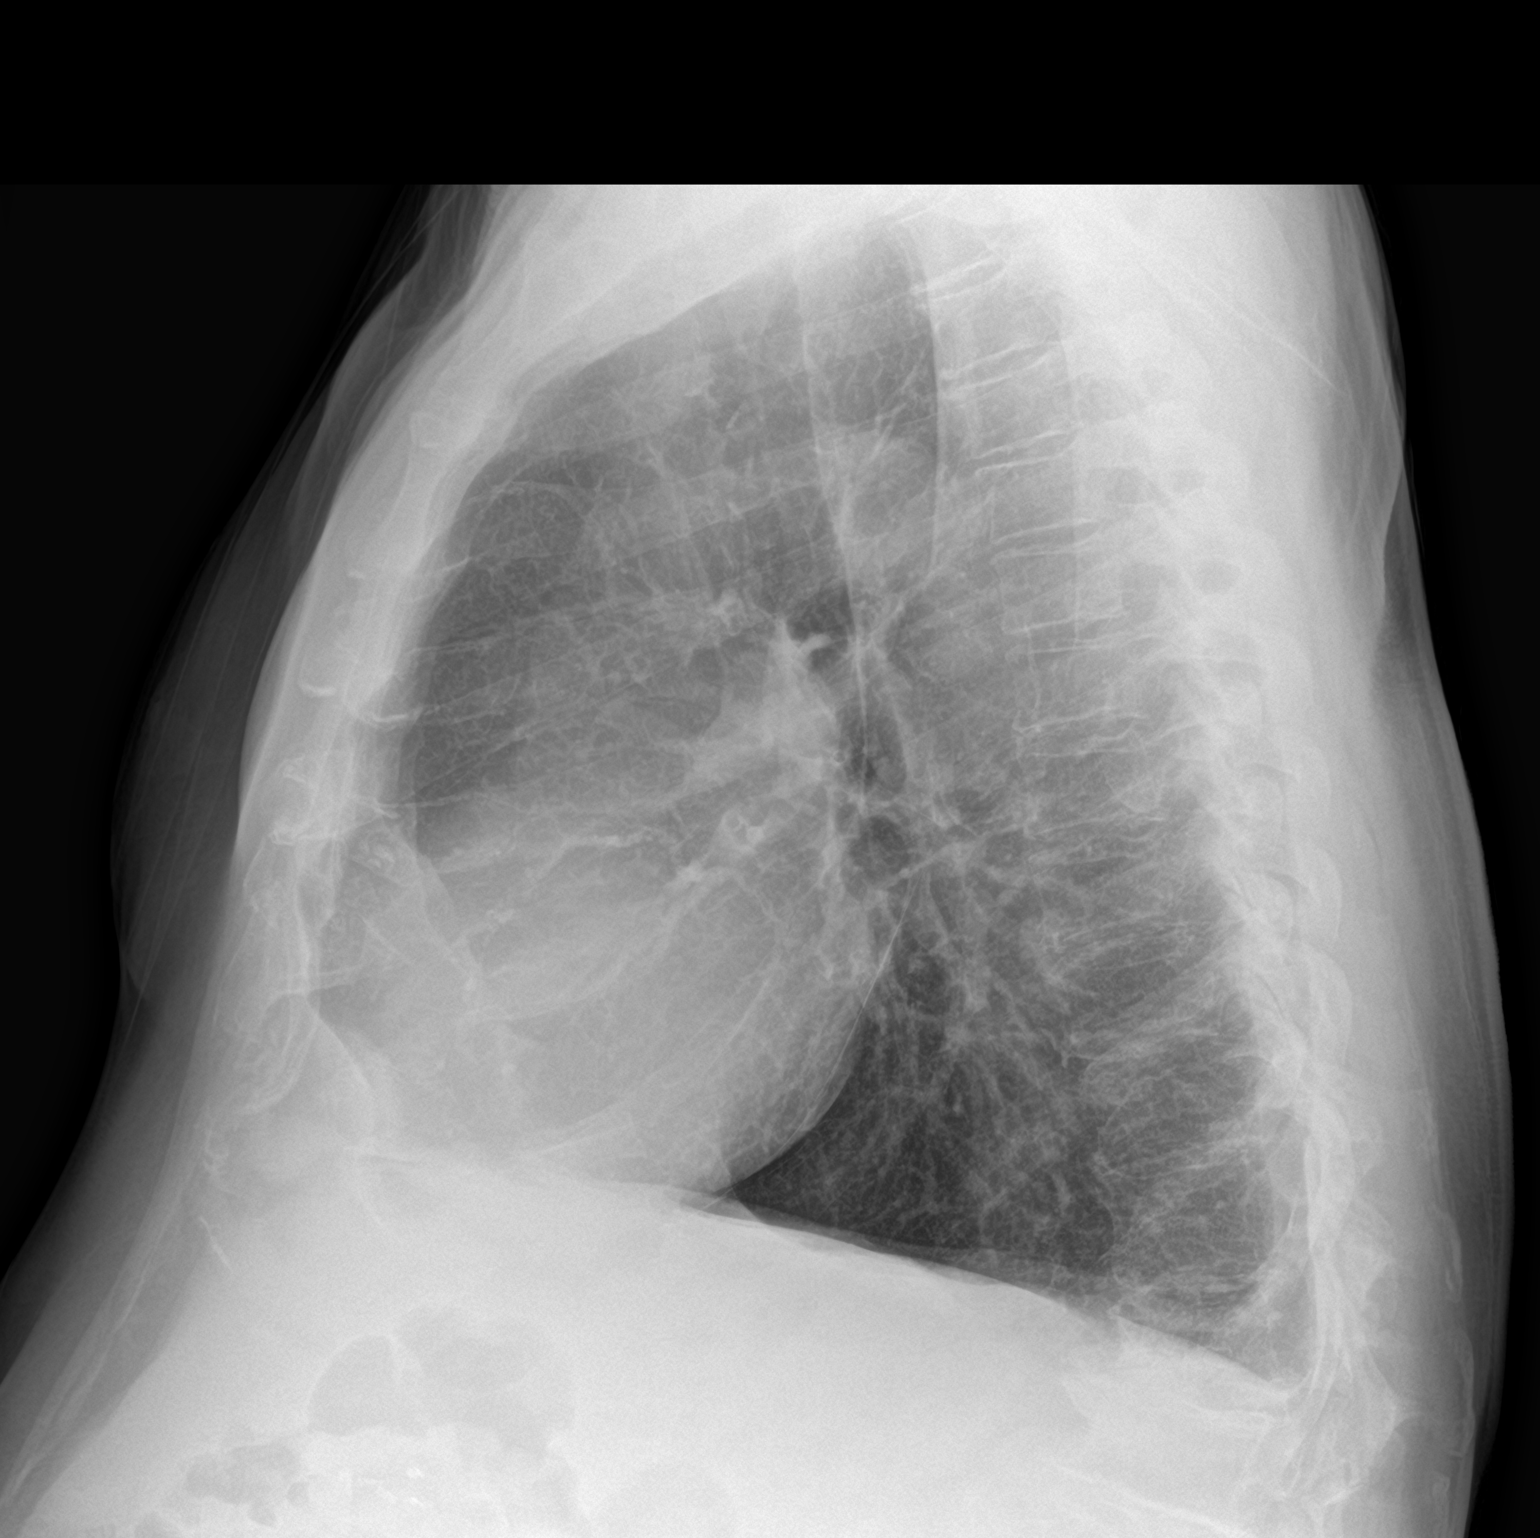

[2 of 2 positions shown; findings below may reference images not displayed]

FINDINGS: Cardiac shadow is stable. The lungs are hyperinflated bilaterally.
No focal infiltrate or sizable effusion is seen. No parenchymal
nodule is noted. No bony abnormality is seen.
IMPRESSION: COPD without acute abnormality.

## 2021-01-31 ENCOUNTER — Ambulatory Visit: Payer: Medicare Other | Admitting: General Practice

## 2021-04-15 ENCOUNTER — Other Ambulatory Visit: Payer: Self-pay | Admitting: Cardiology

## 2021-04-16 NOTE — Telephone Encounter (Signed)
Prescription refill request for Eliquis received. Indication: afib  Last office visit:11/05/2020, Afib  Scr: 2.10, 12/17/2020 Age: 81 yo  Weight: 90.3 kg   Refill sent.

## 2021-06-24 ENCOUNTER — Other Ambulatory Visit: Payer: Self-pay | Admitting: Cardiology

## 2021-06-24 NOTE — Telephone Encounter (Signed)
Prescription refill request for Eliquis received. ?Indication:Afib ?Last office visit:9/22 ?Scr:2.1 ?Age: 81 ?Weight:90.3 kg ? ?Prescription refilled ? ?

## 2021-11-11 NOTE — Progress Notes (Unsigned)
Cardiology Office Note   Date:  11/12/2021   ID:  Ryan Peters, DOB [birth year], MRN [id number]  PCP:  Lenard Simmer, MD  Cardiologist:   Minus Breeding, MD    Chief Complaint  Patient presents with   Atrial Fibrillation       History of Present Illness: Ryan Peters is a 81 y.o. male who presents for evaluation of atrial fibrillation.   Since I last saw him he has had no new problems.  I was able to review some labs from July.  His hemoglobin is 12.5.  It had been low previously and he been off his Eliquis for a while.  His primary provider started him back on this.  I do note that his creatinine is 2.32 however.  This was in July.  He does not see a nephrologist.  He does see a urologist.  He does not feel his atrial fibrillation. The patient denies any new symptoms such as chest discomfort, neck or arm discomfort. There has been no new shortness of breath, PND or orthopnea. There have been no reported palpitations, presyncope or syncope.    Past Medical History:  Diagnosis Date   Arthritis    Atrial fibrillation [facility])    Bladder cancer ([place])    Colon cancer ([place]) 1997   Diabetes mellitus without complication ([place])    History of kidney stones    Hypertension    Scoliosis     Past Surgical History:  Procedure Laterality Date   APPENDECTOMY     CHOLECYSTECTOMY     COLECTOMY  1997   CYSTOSCOPY W/ URETERAL STENT PLACEMENT N/A 04/09/2016   Procedure: CYSTOSCOPY WITH RETROGRADE PYELOGRAM/;  Surgeon: Alexis Frock, MD;  [facility];  Service: Urology;  Laterality: N/A;   TONSILLECTOMY     TRANSURETHRAL RESECTION OF BLADDER TUMOR N/A 04/09/2016   Procedure: TRANSURETHRAL RESECTION OF BLADDER TUMOR (TURBT);  Surgeon: Alexis Frock, MD;  [facility];  Service: Urology;  Laterality: N/A;     Current Outpatient Medications  Medication Sig Dispense Refill   apixaban (ELIQUIS) 5 MG TABS tablet TAKE 1 TABLET BY MOUTH TWICE  DAILY 180 tablet 2   atenolol  (TENORMIN) 25 MG tablet TAKE 1 TABLET BY MOUTH TWICE A DAY 180 tablet 2   Cholecalciferol (VITAMIN D-3) 125 MCG (5000 UT) TABS Take by mouth.     ferrous sulfate 325 (65 FE) MG tablet Take 325 mg by mouth 2 (two) times daily.     metFORMIN (GLUCOPHAGE) 500 MG tablet Take by mouth 2 (two) times daily with a meal.     No current facility-administered medications for this visit.    Allergies:   Patient has no known allergies.    ROS:  Please see the history of present illness.   Otherwise, review of systems are positive for none.   All other systems are reviewed and negative.    PHYSICAL EXAM: VS:  BP 108/72   Pulse 86   Ht '5\' 8"'$  (1.727 m)   Wt 189 lb 3.2 oz (85.8 kg)   SpO2 98%   BMI 28.77 kg/m  , BMI Body mass index is 28.77 kg/m. GENERAL:  Well appearing NECK:  No jugular venous distention, waveform within normal limits, carotid upstroke brisk and symmetric, no bruits, no thyromegaly LUNGS:  Clear to auscultation bilaterally CHEST:  Unremarkable HEART:  PMI not displaced or sustained,S1 and S2 within normal limits, no S3, no clicks, no rubs, no murmurs,irregular ABD:  Flat,  positive bowel sounds normal in frequency in pitch, no bruits, no rebound, no guarding, no midline pulsatile mass, no hepatomegaly, no splenomegaly EXT:  2 plus pulses throughout, mild lateral ankle edema, no cyanosis no clubbing   EKG:  EKG is  ordered today. The ekg ordered today demonstrates atrial fibrillation, rate 84, right bundle branch block, no acute ST-T wave changes.   Recent Labs: No results found for requested labs within last 365 days.    Lipid Panel No results found for: "CHOL", "TRIG", "HDL", "CHOLHDL", "VLDL", "LDLCALC", "LDLDIRECT"    Wt Readings from Last 3 Encounters:  11/12/21 189 lb 3.2 oz (85.8 kg)  11/05/20 199 lb (90.3 kg)  11/03/19 195 lb 6.4 oz (88.6 kg)      Other studies Reviewed: Additional studies/ records that were reviewed today include: Labs Review of the above  records demonstrates: See elsewhere   ASSESSMENT AND PLAN:  ATRIAL FIB :    He is back on his blood thinner.  His hemoglobin is stable.  He had no complications with this.  CHA2DS2-VASc score is 3.  He does not want to consider a watchman.  It would be problematic because they have to travel so far to be on Coumadin.  He does have renal insufficiency and I will repeat his creatinine.  Likely I will at least reduce the dose of Eliquis if his creatinine is elevated as it was in July.    RBBB:   This has been chronic.  No change in therapy.  HTN:       The blood pressure is well controlled.  No change in therapy.    TOBACCO:    He has not been interested in stopping smoking.   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  He does not want to take a lot of medications.  He seems to be euvolemic.  I suspect this is nonischemic.  No change in therapy.    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  He needs to follow-up with his primary provider.  His last A1c he reports was 5.5.  Current medicines are reviewed at length with the patient today.  The patient does not have concerns regarding medicines.  The following changes have been made:  None    Orders Placed This Encounter  Procedures   Basic Metabolic Panel (BMET)   EKG 12-Lead      Disposition:   FU with 6  months.   Signed, Minus Breeding, MD  11/12/2021 2:32 PM    [place]

## 2021-11-12 ENCOUNTER — Ambulatory Visit: Payer: Medicare Other | Attending: Cardiology | Admitting: Cardiology

## 2021-11-12 ENCOUNTER — Encounter: Payer: Self-pay | Admitting: Cardiology

## 2021-11-12 VITALS — BP 108/72 | HR 86 | Ht 68.0 in | Wt 189.2 lb

## 2021-11-12 DIAGNOSIS — I4510 Unspecified right bundle-branch block: Secondary | ICD-10-CM

## 2021-11-12 DIAGNOSIS — Z720 Tobacco use: Secondary | ICD-10-CM | POA: Diagnosis not present

## 2021-11-12 DIAGNOSIS — I10 Essential (primary) hypertension: Secondary | ICD-10-CM | POA: Diagnosis not present

## 2021-11-12 DIAGNOSIS — I482 Chronic atrial fibrillation, unspecified: Secondary | ICD-10-CM | POA: Diagnosis not present

## 2021-11-12 NOTE — Patient Instructions (Signed)
  Follow-Up: At [facility], you and your health needs are our priority.  As part of our continuing mission to provide you with exceptional heart care, we have created designated Provider Care Teams.  These Care Teams include your primary Cardiologist (physician) and Advanced Practice Providers (APPs -  Physician Assistants and Nurse Practitioners) who all work together to provide you with the care you need, when you need it.  We recommend signing up for the patient portal called "MyChart".  Sign up information is provided on this After Visit Summary.  MyChart is used to connect with patients for Virtual Visits (Telemedicine).  Patients are able to view lab/test results, encounter notes, upcoming appointments, etc.  Non-urgent messages can be sent to your provider as well.   To learn more about what you can do with MyChart, go to [email or web address].    Your next appointment:   6 month(s)  The format for your next appointment:   In Person  Provider:   Deronte Hochrein, MD           

## 2021-11-13 LAB — BASIC METABOLIC PANEL
BUN/Creatinine Ratio: 12 (ref 10–24)
BUN: 23 mg/dL (ref 8–27)
CO2: 17 mmol/L — ABNORMAL LOW (ref 20–29)
Calcium: 9.1 mg/dL (ref 8.6–10.2)
Chloride: 103 mmol/L (ref 96–106)
Creatinine, Ser: 1.87 mg/dL — ABNORMAL HIGH (ref 0.76–1.27)
Glucose: 91 mg/dL (ref 70–99)
Potassium: 5.1 mmol/L (ref 3.5–5.2)
Sodium: 136 mmol/L (ref 134–144)
eGFR: 36 mL/min/{1.73_m2} — ABNORMAL LOW (ref >59)

## 2021-11-14 ENCOUNTER — Other Ambulatory Visit: Payer: Self-pay | Admitting: *Deleted

## 2021-11-14 DIAGNOSIS — I10 Essential (primary) hypertension: Secondary | ICD-10-CM

## 2021-12-13 LAB — BASIC METABOLIC PANEL
BUN/Creatinine Ratio: 10 (ref 10–24)
BUN: 20 mg/dL (ref 8–27)
CO2: 17 mmol/L — ABNORMAL LOW (ref 20–29)
Calcium: 9 mg/dL (ref 8.6–10.2)
Chloride: 104 mmol/L (ref 96–106)
Creatinine, Ser: 2.03 mg/dL — ABNORMAL HIGH (ref 0.76–1.27)
Glucose: 95 mg/dL (ref 70–99)
Potassium: 5 mmol/L (ref 3.5–5.2)
Sodium: 138 mmol/L (ref 134–144)
eGFR: 33 mL/min/{1.73_m2} — ABNORMAL LOW (ref >59)

## 2021-12-14 IMAGING — CR DG CHEST 2V
2 series · 2 of 2 positions shown · non-contrast
Comparison: X-ray chest 12/21/2019.

CLINICAL DATA: Malignant neoplasm of lateral wall of urinary
bladder.

EXAM:
CHEST - 2 VIEW

[w chest pa]
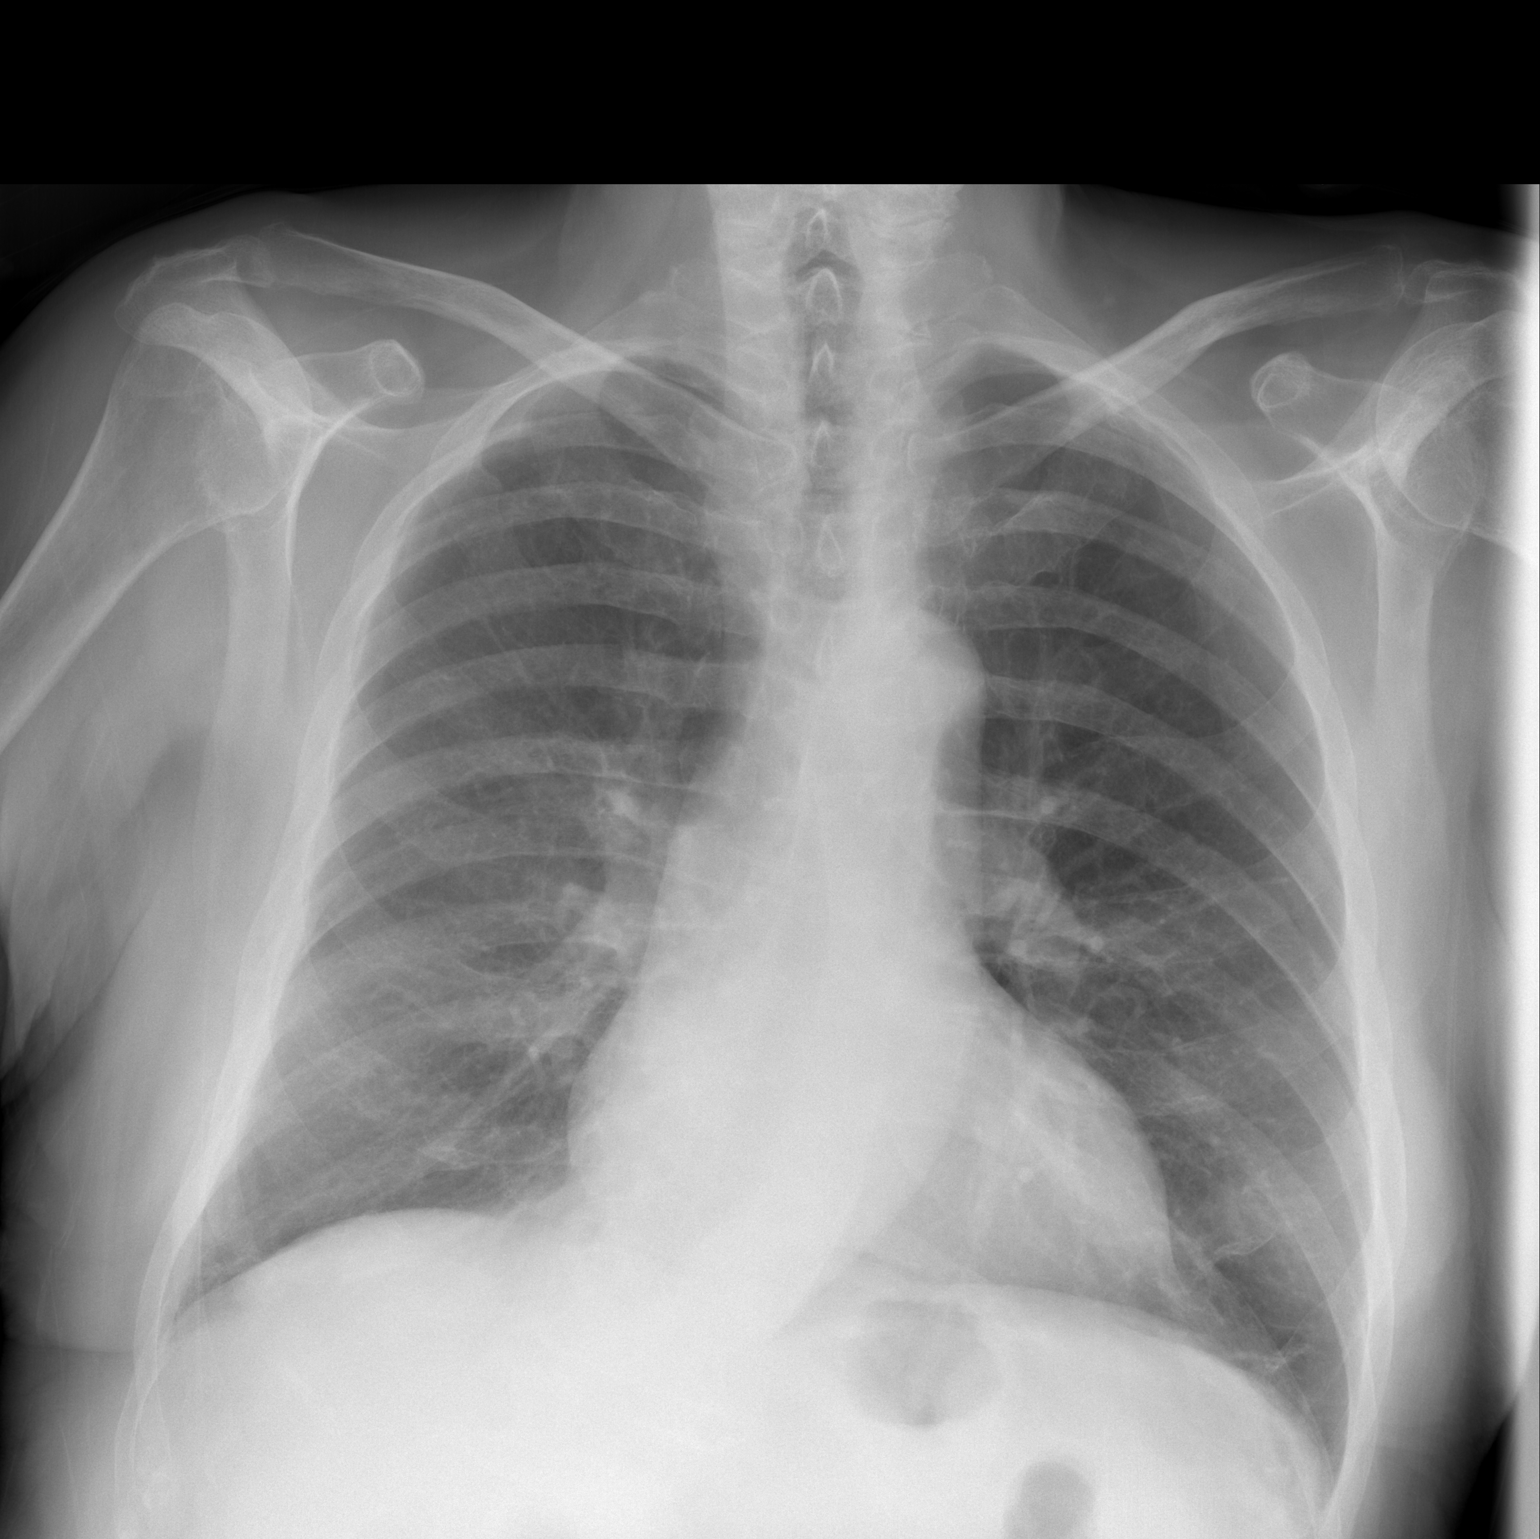

[w chest lat]
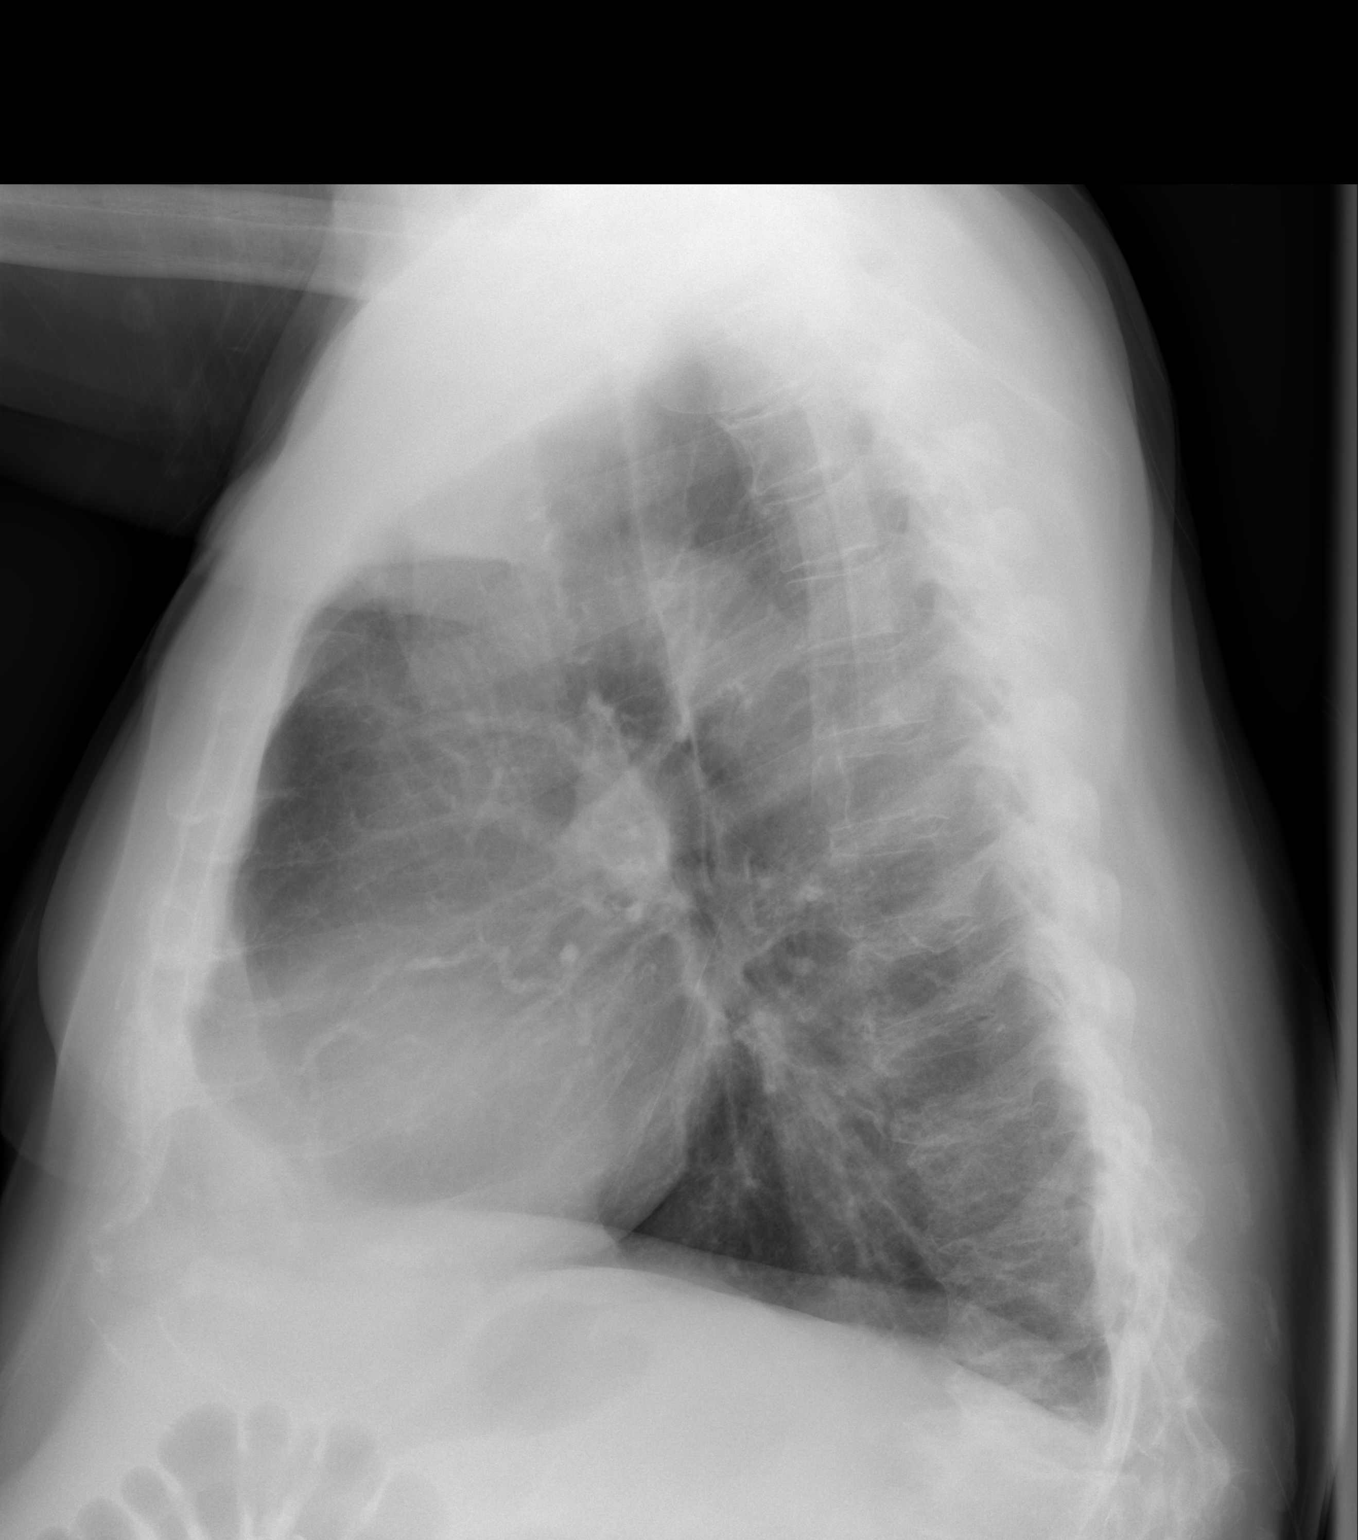

[2 of 2 positions shown; findings below may reference images not displayed]

FINDINGS: The heart size and mediastinal contours are within normal limits.
Both lungs are clear. No pleural effusion. The visualized skeletal
structures are unremarkable.
IMPRESSION: No acute process in the chest.

## 2021-12-23 ENCOUNTER — Telehealth: Payer: Self-pay | Admitting: *Deleted

## 2021-12-23 DIAGNOSIS — N289 Disorder of kidney and ureter, unspecified: Secondary | ICD-10-CM

## 2021-12-23 NOTE — Telephone Encounter (Signed)
Spoke with pt wife, aware of the results. He does see a urologist and is currently having some bleeding from his bladder. He has an appointment with the urologist on 01/02/22. She reports clots and blood in urine. She reports he feels tired but no other problems. It has been going on for about 3 weeks now. But over the last several days his urine has been clear.

## 2022-01-07 ENCOUNTER — Other Ambulatory Visit: Payer: Self-pay | Admitting: Cardiology

## 2022-01-07 NOTE — Telephone Encounter (Signed)
Prescription refill request for Eliquis received. Indication:afib Last office visit:9/23 Scr:2.0 Age: 81 Weight:85.8  kg  Prescription refilled

## 2022-01-27 ENCOUNTER — Other Ambulatory Visit (HOSPITAL_COMMUNITY): Payer: Self-pay | Admitting: Urology

## 2022-01-27 ENCOUNTER — Ambulatory Visit (HOSPITAL_COMMUNITY)
Admission: RE | Admit: 2022-01-27 | Discharge: 2022-01-27 | Disposition: A | Discharge status: Home / Self Care | Payer: Medicare Other | Source: Ambulatory Visit | Attending: Urology | Admitting: Urology

## 2022-01-27 DIAGNOSIS — C672 Malignant neoplasm of lateral wall of bladder: Secondary | ICD-10-CM

## 2022-04-28 ENCOUNTER — Other Ambulatory Visit (HOSPITAL_COMMUNITY): Payer: Self-pay | Admitting: Nurse Practitioner

## 2022-04-28 ENCOUNTER — Telehealth: Payer: Self-pay | Admitting: Cardiology

## 2022-04-28 DIAGNOSIS — Z01818 Encounter for other preprocedural examination: Secondary | ICD-10-CM

## 2022-04-28 DIAGNOSIS — R609 Edema, unspecified: Secondary | ICD-10-CM

## 2022-04-28 DIAGNOSIS — R0602 Shortness of breath: Secondary | ICD-10-CM

## 2022-04-28 NOTE — Telephone Encounter (Signed)
[facility] office would like a callback in regards to pt needing to have an Echocardiogram done before 3/18 and since we don't have an opening until 4/3, they wanted to know if it would be okay for them to do the Echocardiogram at their [place] office instead. She also stated they didn't need the order sent over there, they just need to know if its okay for them to do it there. Please advise.

## 2022-04-28 NOTE — Telephone Encounter (Signed)
Per chart review, patient is scheduled for echo 04/30/22  Attempted too call back [facility] office x2 and there was no answer and only a generic VM box

## 2022-04-29 NOTE — Telephone Encounter (Signed)
Spoke with nurse at [facility].  She verifies receiving message on patient regarding scheduled echo. They have him as scheduled for tomorrow on their records.  No follow up needed.

## 2022-04-30 ENCOUNTER — Ambulatory Visit (HOSPITAL_COMMUNITY): Payer: Medicare Other | Attending: Cardiovascular Disease

## 2022-04-30 DIAGNOSIS — R0602 Shortness of breath: Secondary | ICD-10-CM | POA: Diagnosis not present

## 2022-04-30 DIAGNOSIS — R609 Edema, unspecified: Secondary | ICD-10-CM | POA: Diagnosis present

## 2022-04-30 DIAGNOSIS — Z01818 Encounter for other preprocedural examination: Secondary | ICD-10-CM | POA: Insufficient documentation

## 2022-04-30 LAB — ECHOCARDIOGRAM COMPLETE: S' Lateral: 2.6 cm

## 2022-05-08 DIAGNOSIS — N184 Chronic kidney disease, stage 4 (severe): Secondary | ICD-10-CM | POA: Insufficient documentation

## 2022-05-08 NOTE — Progress Notes (Signed)
Cardiology Office Note   Date:  05/09/2022   ID:  Ryan Peters, DOB 05/01/1940, MRN [id number]  PCP:  Lenard Simmer, MD  Cardiologist:   Minus Breeding, MD    Chief Complaint  Patient presents with   Edema       History of Present Illness: Ryan Peters is a 82 y.o. male who presents for evaluation of atrial fibrillation.   Since I last saw him he has had significant issues.  He has bladder cancer.  His kidney stone.  He had severe hematuria.  He had to be hospitalized at another facility and I did review these records.  He had a CT this part of this workup and I see a distal ureteral lesion that is suspicious for neoplasm.  He has kidney stones.  He is also felt to have some inflammation in the transverse colon and might need a colonoscopy.  He is probably going to need laser treatment of ureteral lesion and lithotripsy.  However, he has significant anemia as a result of all of this.  He is actually going to see hematology for possible transfusion.  He had a transfusion as part of his hospitalization.  I only briefly held his Eliquis and has been back on this.  He has chronic renal insufficiency with a creatinine of 2.  With all of this he has been short of breath for the past 4 to 5 months and ambulating with a rollator.  He was using a cane.  He has physical therapy and a home health nurse coming.  He had some lower extremity swelling and has been given some Lasix.  Because of his back problems and knee problems it is hard for him to keep his feet elevated.  He does not salt but does not really added a lot.  He had some salty foods.  He is not having any new chest pressure, neck or arm discomfort.   Past Medical History:  Diagnosis Date   Arthritis    Atrial fibrillation [facility])    Bladder cancer ([place])    Colon cancer ([place]) 1997   Diabetes mellitus without complication ([place])    History of kidney stones    Hypertension    Scoliosis     Past Surgical History:  Procedure  Laterality Date   APPENDECTOMY     CHOLECYSTECTOMY     COLECTOMY  1997   CYSTOSCOPY W/ URETERAL STENT PLACEMENT N/A 04/09/2016   Procedure: CYSTOSCOPY WITH RETROGRADE PYELOGRAM/;  Surgeon: Alexis Frock, MD;  Location: [facility];  Service: Urology;  Laterality: N/A;   TONSILLECTOMY     TRANSURETHRAL RESECTION OF BLADDER TUMOR N/A 04/09/2016   Procedure: TRANSURETHRAL RESECTION OF BLADDER TUMOR (TURBT);  Surgeon: Alexis Frock, MD;  Location: [facility];  Service: Urology;  Laterality: N/A;     Current Outpatient Medications  Medication Sig Dispense Refill   atenolol (TENORMIN) 25 MG tablet TAKE 1 TABLET BY MOUTH TWICE A DAY 180 tablet 2   Cholecalciferol (VITAMIN D-3) 125 MCG (5000 UT) TABS Take by mouth.     ELIQUIS 5 MG TABS tablet TAKE 1 TABLET BY MOUTH TWICE  DAILY 200 tablet 2   ferrous sulfate 325 (65 FE) MG tablet Take 325 mg by mouth 2 (two) times daily.     furosemide (LASIX) 20 MG tablet Take 20 mg by mouth daily.     No current facility-administered medications for this visit.    Allergies:   Patient has no known allergies.  ROS:  Please see the history of present illness.   Otherwise, review of systems are positive for none.   All other systems are reviewed and negative.    PHYSICAL EXAM: VS:  BP 108/64   Pulse 87   Ht 5\' 8"  (1.727 m)   Wt 173 lb (78.5 kg)   BMI 26.30 kg/m  , BMI Body mass index is 26.3 kg/m. GENERAL:  Well appearing NECK:  No jugular venous distention, waveform within normal limits, carotid upstroke brisk and symmetric, no bruits, no thyromegaly LUNGS:  Clear to auscultation bilaterally CHEST:  Unremarkable HEART:  PMI not displaced or sustained,S1 and S2 within normal limits, no S3, no clicks, no rubs, no murmurs, irregular ABD:  Flat, positive bowel sounds normal in frequency in pitch, no bruits, no rebound, no guarding, no midline pulsatile mass, no hepatomegaly, no splenomegaly EXT:  2 plus pulses upper with decreased dorsalis pedis and  posterior tibialis bilateral lower, moderate edema, no cyanosis no clubbing, left leg    EKG:  EKG is   ordered today. The ekg ordered today demonstrates atrial fibrillation, rate 87, right bundle branch block, no acute ST-T wave changes.   Recent Labs: 12/12/2021: BUN 20; Creatinine, Ser 2.03; Potassium 5.0; Sodium 138    Lipid Panel No results found for: "CHOL", "TRIG", "HDL", "CHOLHDL", "VLDL", "LDLCALC", "LDLDIRECT"    Wt Readings from Last 3 Encounters:  05/09/22 173 lb (78.5 kg)  11/12/21 189 lb 3.2 oz (85.8 kg)  11/05/20 199 lb (90.3 kg)      Other studies Reviewed: Additional studies/ records that were reviewed today include: Extensive review of hospital records Review of the above records demonstrates: See elsewhere   ASSESSMENT AND PLAN:  ATRIAL FIB :    CHA2DS2-VASc score is 3.  He does not want to consider a watchman.  For now he is continuing the Eliquis.  In July when he is 82 years the actual dose should be reduced to 2.5.  I would have a low threshold to hold his Eliquis if he has recurrent bleeding.  RBBB:   This is unchanged.  No change in therapy.   HTN:       The blood pressure is well-controlled.  No change in therapy.   TOBACCO:   We have had this discussion many times.    CARDIOMYOPATHY: His EF is  45% on recent echo and unchanged.    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  This is relatively unchanged from previous.  EDEMA: We talked about salt restriction.  We talked about trying to keep his feet elevated.  He has a small nonhealing ulcer on the left leg so he has to be careful about compression stockings.  I would not push his diuretics because of his renal insufficiency.  PREOP: The patient is at acceptable risk for any planned procedures.  According to a ACC/AHA guidelines no change in therapy or further testing is indicated.  Current medicines are reviewed at length with the patient today.  The patient does not have concerns regarding medicines.  The  following changes have been made:  None    Orders Placed This Encounter  Procedures   EKG 12-Lead      Disposition:   FU with 6 months.   Signed, Minus Breeding, MD  05/09/2022 2:32 PM    [place]

## 2022-05-09 ENCOUNTER — Encounter: Payer: Self-pay | Admitting: Cardiology

## 2022-05-09 ENCOUNTER — Ambulatory Visit: Payer: Medicare Other | Attending: Cardiology | Admitting: Cardiology

## 2022-05-09 VITALS — BP 108/64 | HR 87 | Ht 68.0 in | Wt 173.0 lb

## 2022-05-09 DIAGNOSIS — N184 Chronic kidney disease, stage 4 (severe): Secondary | ICD-10-CM | POA: Diagnosis not present

## 2022-05-09 DIAGNOSIS — I482 Chronic atrial fibrillation, unspecified: Secondary | ICD-10-CM | POA: Diagnosis not present

## 2022-05-09 DIAGNOSIS — I420 Dilated cardiomyopathy: Secondary | ICD-10-CM | POA: Diagnosis not present

## 2022-05-09 DIAGNOSIS — Z720 Tobacco use: Secondary | ICD-10-CM

## 2022-05-09 NOTE — Patient Instructions (Signed)
Medication Instructions:  Your physician recommends that you continue on your current medications as directed. Please refer to the Current Medication list given to you today.  *If you need a refill on your cardiac medications before your next appointment, please call your pharmacy*   Follow-Up: At [facility], you and your health needs are our priority.  As part of our continuing mission to provide you with exceptional heart care, we have created designated Provider Care Teams.  These Care Teams include your primary Cardiologist (physician) and Advanced Practice Providers (APPs -  Physician Assistants and Nurse Practitioners) who all work together to provide you with the care you need, when you need it.  We recommend signing up for the patient portal called "MyChart".  Sign up information is provided on this After Visit Summary.  MyChart is used to connect with patients for Virtual Visits (Telemedicine).  Patients are able to view lab/test results, encounter notes, upcoming appointments, etc.  Non-urgent messages can be sent to your provider as well.   To learn more about what you can do with MyChart, go to [email or web address].    Your next appointment:   6 month(s)  Provider:   Minus Breeding, MD

## 2022-11-10 DIAGNOSIS — M7989 Other specified soft tissue disorders: Secondary | ICD-10-CM | POA: Insufficient documentation

## 2022-11-10 NOTE — Progress Notes (Deleted)
Cardiology Office Note:   Date:  11/10/2022  ID:  Ryan Peters, DOB 05-03-1940, MRN [id number] PCP: Alan Mulder, MD  [facility] Providers Cardiologist:  Rollene Rotunda, MD {  History of Present Illness:   Ryan Peters is a 82 y.o. male who presents for evaluation of atrial fibrillation. Since I last saw him ***   *** he has had significant issues. He has bladder cancer. His kidney stone. He had severe hematuria. He had to be hospitalized at another facility and I did review these records. He had a CT this part of this workup and I see a distal ureteral lesion that is suspicious for neoplasm. He has kidney stones. He is also felt to have some inflammation in the transverse colon and might need a colonoscopy. He is probably going to need laser treatment of ureteral lesion and lithotripsy. However, he has significant anemia as a result of all of this. He is actually going to see hematology for possible transfusion. He had a transfusion as part of his hospitalization. I only briefly held his Eliquis and has been back on this. He has chronic renal insufficiency with a creatinine of 2. With all of this he has been short of breath for the past 4 to 5 months and ambulating with a rollator. He was using a cane. He has physical therapy and a home health nurse coming. He had some lower extremity swelling and has been given some Lasix. Because of his back problems and knee problems it is hard for him to keep his feet elevated. He does not salt but does not really added a lot. He had some salty foods. He is not having any new chest pressure, neck or arm discomfort.   ROS: ***  Studies Reviewed:    EKG:       ***  Risk Assessment/Calculations:   {Does this patient have ATRIAL FIBRILLATION?:[phone]} No BP recorded.  {Refresh Note OR Click here to enter BP  :1}***        Physical Exam:   VS:  There were no vitals taken for this visit.   Wt Readings from Last 3 Encounters:  05/09/22 173  lb (78.5 kg)  11/12/21 189 lb 3.2 oz (85.8 kg)  11/05/20 199 lb (90.3 kg)     GEN: Well nourished, well developed in no acute distress NECK: No JVD; No carotid bruits CARDIAC: ***RRR, no murmurs, rubs, gallops RESPIRATORY:  Clear to auscultation without rales, wheezing or rhonchi  ABDOMEN: Soft, non-tender, non-distended EXTREMITIES:  No edema; No deformity   ASSESSMENT AND PLAN:   ATRIAL FIB :    CHA2DS2-VASc score is ***   3.  He does not want to consider a watchman.  For now he is continuing the Eliquis.  In July when he is 82 years the actual dose should be reduced to 2.5.  I would have a low threshold to hold his Eliquis if he has recurrent bleeding.   RBBB:   This is ***  unchanged.  No change in therapy.    HTN:       The blood pressure is ***  well-controlled.  No change in therapy.    TOBACCO:   ***   We have had this discussion many times.     CARDIOMYOPATHY: His EF is  45% ***  on recent echo and unchanged.     CKD IV: Creatinine was  ***  2.0.  This is relatively unchanged from previous.   EDEMA: ***  We talked  about salt restriction.  We talked about trying to keep his feet elevated.  He has a small nonhealing ulcer on the left leg so he has to be careful about compression stockings.  I would not push his diuretics because of his renal insufficiency.      {Are you ordering a CV Procedure (e.g. stress test, cath, DCCV, TEE, etc)?   Press F2        :[id number]}  Follow up ***  Signed, Rollene Rotunda, MD

## 2022-11-11 ENCOUNTER — Ambulatory Visit: Payer: Medicare Other | Attending: Cardiology | Admitting: Cardiology

## 2022-11-11 ENCOUNTER — Encounter: Payer: Self-pay | Admitting: Cardiology

## 2022-11-11 ENCOUNTER — Ambulatory Visit: Payer: Medicare Other | Admitting: Cardiology

## 2022-11-11 VITALS — BP 110/70 | HR 81 | Ht 67.0 in | Wt 188.0 lb

## 2022-11-11 DIAGNOSIS — I482 Chronic atrial fibrillation, unspecified: Secondary | ICD-10-CM | POA: Diagnosis not present

## 2022-11-11 DIAGNOSIS — I10 Essential (primary) hypertension: Secondary | ICD-10-CM

## 2022-11-11 DIAGNOSIS — N184 Chronic kidney disease, stage 4 (severe): Secondary | ICD-10-CM

## 2022-11-11 DIAGNOSIS — M7989 Other specified soft tissue disorders: Secondary | ICD-10-CM

## 2022-11-11 DIAGNOSIS — I420 Dilated cardiomyopathy: Secondary | ICD-10-CM

## 2022-11-11 MED ORDER — APIXABAN 2.5 MG PO TABS: 2.5000 mg | ORAL_TABLET | Freq: Two times a day (BID) | ORAL | 2 refills | Status: DC

## 2022-11-11 NOTE — Patient Instructions (Signed)
Medication Instructions:    Start taking Eliquis 2.5 mg twice a day  *If you need a refill on your cardiac medications before your next appointment, please call your pharmacy*   Lab Work:   Not needed   Testing/Procedures:  Not needed  Follow-Up: At [facility], you and your health needs are our priority.  As part of our continuing mission to provide you with exceptional heart care, we have created designated Provider Care Teams.  These Care Teams include your primary Cardiologist (physician) and Advanced Practice Providers (APPs -  Physician Assistants and Nurse Practitioners) who all work together to provide you with the care you need, when you need it.  We recommend signing up for the patient portal called "MyChart".  Sign up information is provided on this After Visit Summary.  MyChart is used to connect with patients for Virtual Visits (Telemedicine).  Patients are able to view lab/test results, encounter notes, upcoming appointments, etc.  Non-urgent messages can be sent to your provider as well.   To learn more about what you can do with MyChart, go to [email or web address].    Your next appointment:   12 month(s)  The format for your next appointment:   In Person  Provider:   Rollene Rotunda, MD

## 2022-11-11 NOTE — Progress Notes (Signed)
Cardiology Office Note:   Date:  11/11/2022  ID:  Ryan Peters, DOB 1940/04/11, MRN [id number] PCP: Alan Mulder, MD  [facility] Providers Cardiologist:  Rollene Rotunda, MD {  History of Present Illness:   Ryan Peters is a 82 y.o. male who presents for evaluation of atrial fibrillation.   Since I saw him he was to have a urologic procedure for hematuria.  However, he did not have any procedures and his hematuria has cleared up.  He actually has been off of his Eliquis because he has anemia.  His hemoglobin is going up.  He denies any new cardiovascular symptoms. The patient denies any new symptoms such as chest discomfort, neck or arm discomfort. There has been no new shortness of breath, PND or orthopnea. There have been no reported palpitations, presyncope or syncope.  He walks with a walker in his house and with a cane elsewhere.  It sounds like he is fairly limited somewhat by joint problems and back pain.   ROS: As stated in the HPI and negative for all other systems.  Studies Reviewed:    EKG:   EKG Interpretation Date/Time:  Tuesday November 11 2022 13:17:45 EDT Ventricular Rate:  81 PR Interval:    QRS Duration:  148 QT Interval:  406 QTC Calculation: 471 R Axis:   -37  Text Interpretation: Atrial fibrillation Left axis deviation Right bundle branch block When compared with ECG of 06-Sep-2017 17:48, no change Confirmed by Rollene Rotunda ([id number]) on 11/11/2022 1:49:44 PM     Risk Assessment/Calculations:    CHA2DS2-VASc Score = 4   This indicates a 4.8% annual risk of stroke. The patient's score is based upon: CHF History: 1 HTN History: 1 Diabetes History: 0 Stroke History: 0 Vascular Disease History: 0 Age Score: 2 Gender Score: 0       Physical Exam:   VS:  BP 110/70   Pulse 81   Ht 5\' 7"  (1.702 m)   Wt 188 lb (85.3 kg)   SpO2 99%   BMI 29.44 kg/m    Wt Readings from Last 3 Encounters:  11/11/22 188 lb (85.3 kg)  05/09/22 173 lb (78.5 kg)   11/12/21 189 lb 3.2 oz (85.8 kg)     GEN: Well nourished, well developed in no acute distress NECK: No JVD; No carotid bruits CARDIAC: IrregularRR, no murmurs, rubs, gallops RESPIRATORY:  Clear to auscultation without rales, wheezing or rhonchi  ABDOMEN: Soft, non-tender, non-distended EXTREMITIES:  No edema; No deformity   ASSESSMENT AND PLAN:   ATRIAL FIB :    CHA2DS2-VASc score is 3.  He does not want to consider a watchman.  I showed him the video.  He wants to restart Eliquis because he thinks this is acceptable with physician following his anemia.  He now would be on 2.5 mg twice daily as he is turned 82.  He will look out for hematuria.  A CBC is planned for 5 weeks.   RBBB:   This is unchanged.  No change in therapy.   HTN:       The blood pressure is at target.  No change in therapy.   TOBACCO:       We have had this discussion many times.     CARDIOMYOPATHY: His EF is 45% on recent echo in March 2024.  He has not wanted a lot of med titration.   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  This is followed by his primary provider.   EDEMA:  This is improved.  He had an ulcer that was not healing.  This is now healed.  No change in therapy.            Follow up with me in one year.  Signed, Rollene Rotunda, MD

## 2023-07-19 DEATH — deceased
